# Patient Record
Sex: Female | Born: 1980 | Race: Black or African American | Hispanic: No | Marital: Single | Smoking: Current every day smoker
Health system: Southern US, Community
[De-identification: ages and names within clinical notes are randomized; demographics above are authoritative.]

---

## 2015-02-12 LAB — HM HIV SCREENING LAB: HM HIV Screening: NEGATIVE

## 2015-02-12 LAB — HM PAP SMEAR: HM Pap smear: NEGATIVE

## 2015-03-26 ENCOUNTER — Emergency Department: Payer: 59

## 2015-03-26 ENCOUNTER — Emergency Department
Admission: EM | Admit: 2015-03-26 | Discharge: 2015-03-26 | Disposition: A | Discharge status: Home / Self Care | Payer: 59 | Attending: Emergency Medicine | Admitting: Emergency Medicine

## 2015-03-26 ENCOUNTER — Encounter: Payer: Self-pay | Admitting: Emergency Medicine

## 2015-03-26 DIAGNOSIS — J069 Acute upper respiratory infection, unspecified: Secondary | ICD-10-CM | POA: Diagnosis not present

## 2015-03-26 DIAGNOSIS — R0602 Shortness of breath: Secondary | ICD-10-CM | POA: Diagnosis present

## 2015-03-26 DIAGNOSIS — F17200 Nicotine dependence, unspecified, uncomplicated: Secondary | ICD-10-CM | POA: Diagnosis not present

## 2015-03-26 DIAGNOSIS — R059 Cough, unspecified: Secondary | ICD-10-CM

## 2015-03-26 DIAGNOSIS — Z3202 Encounter for pregnancy test, result negative: Secondary | ICD-10-CM | POA: Insufficient documentation

## 2015-03-26 DIAGNOSIS — R05 Cough: Secondary | ICD-10-CM

## 2015-03-26 LAB — COMPREHENSIVE METABOLIC PANEL
ALBUMIN: 4.1 g/dL (ref 3.5–5.0)
ALK PHOS: 35 U/L — AB (ref 38–126)
ALT: 19 U/L (ref 14–54)
ANION GAP: 5 (ref 5–15)
AST: 19 U/L (ref 15–41)
BUN: 11 mg/dL (ref 6–20)
CALCIUM: 8.9 mg/dL (ref 8.9–10.3)
CHLORIDE: 109 mmol/L (ref 101–111)
CO2: 25 mmol/L (ref 22–32)
Creatinine, Ser: 0.7 mg/dL (ref 0.44–1.00)
GFR calc non Af Amer: >60 mL/min (ref >60)
GLUCOSE: 102 mg/dL — AB (ref 65–99)
POTASSIUM: 4.6 mmol/L (ref 3.5–5.1)
SODIUM: 139 mmol/L (ref 135–145)
Total Bilirubin: 0.7 mg/dL (ref 0.3–1.2)
Total Protein: 7.6 g/dL (ref 6.5–8.1)

## 2015-03-26 LAB — URINALYSIS COMPLETE WITH MICROSCOPIC (ARMC ONLY)
BILIRUBIN URINE: NEGATIVE
Bacteria, UA: NONE SEEN
Glucose, UA: NEGATIVE mg/dL
KETONES UR: NEGATIVE mg/dL
Nitrite: NEGATIVE
PH: 5 (ref 5.0–8.0)
Protein, ur: NEGATIVE mg/dL
Specific Gravity, Urine: 1.028 (ref 1.005–1.030)

## 2015-03-26 LAB — CBC
HEMATOCRIT: 42.5 % (ref 35.0–47.0)
HEMOGLOBIN: 14.1 g/dL (ref 12.0–16.0)
MCH: 27.4 pg (ref 26.0–34.0)
MCHC: 33.2 g/dL (ref 32.0–36.0)
MCV: 82.5 fL (ref 80.0–100.0)
Platelets: 188 10*3/uL (ref 150–440)
RBC: 5.16 MIL/uL (ref 3.80–5.20)
RDW: 14 % (ref 11.5–14.5)
WBC: 4.6 10*3/uL (ref 3.6–11.0)

## 2015-03-26 LAB — POCT PREGNANCY, URINE: Preg Test, Ur: NEGATIVE

## 2015-03-26 LAB — LIPASE, BLOOD: LIPASE: 26 U/L (ref 11–51)

## 2015-03-26 LAB — TROPONIN I: Troponin I: <0.03 ng/mL (ref <0.031)

## 2015-03-26 NOTE — ED Provider Notes (Signed)
[facility] Emergency Department Provider Note    ____________________________________________  Time seen: 1215  I have reviewed the triage vital signs and the nursing notes.   HISTORY  Chief Complaint Shortness of Breath; Emesis; and Weakness   History limited by: Not Limited   HPI Briana Jacobson is a 34 y.o. female presents to the emergency department today because of concerns for some cough, shortness of breath. Patient states his symptoms have been present for the past 3 days. She states that she has felt sick. She has tried Tylenol Cold medication without great relief. The patient states today at work she told them that she was feeling bad and the nurse checked on her. The nurse noticed an irregular heart rhythm and asked the patient to the emergency department. Of note the patient states that her mother had been sick and her kids had also been sick. The patient has had some fevers.   History reviewed. No pertinent past medical history.  There are no active problems to display for this patient.   History reviewed. No pertinent past surgical history.  No current outpatient prescriptions on file.  Allergies Review of patient's allergies indicates no known allergies.  No family history on file.  Social History Social History  Substance Use Topics  . Smoking status: Current Every Day Smoker  . Smokeless tobacco: None  . Alcohol Use: No    Review of Systems  Constitutional: Positive for fever. Cardiovascular: Negative for chest pain. Respiratory: Positive for shortness of breath. Gastrointestinal: Negative for abdominal pain, vomiting and diarrhea. Neurological: Negative for headaches, focal weakness or numbness.  10-point ROS otherwise negative.  ____________________________________________   PHYSICAL EXAM:  VITAL SIGNS: ED Triage Vitals  Enc Vitals Group     BP 03/26/15 0913 125/76 mmHg     Pulse Rate 03/26/15 0913 91     Resp  --      Temp 03/26/15 0913 98.2 F (36.8 C)     Temp Source 03/26/15 0913 Oral     SpO2 03/26/15 0913 97 %     Weight 03/26/15 0911 185 lb (83.915 kg)     Height 03/26/15 0911  (1.549 m)   Constitutional: Alert and oriented. Well appearing and in no distress. Eyes: Conjunctivae are normal. PERRL. Normal extraocular movements. ENT   Head: Normocephalic and atraumatic.   Nose: No congestion/rhinnorhea.   Mouth/Throat: Mucous membranes are moist.   Neck: No stridor. Hematological/Lymphatic/Immunilogical: No cervical lymphadenopathy. Cardiovascular: Normal rate, regular rhythm.  No murmurs, rubs, or gallops. Respiratory: Normal respiratory effort without tachypnea nor retractions. Breath sounds are clear and equal bilaterally. No wheezes/rales/rhonchi. Gastrointestinal: Soft and nontender. No distention.  Genitourinary: Deferred Musculoskeletal: Normal range of motion in all extremities. No joint effusions.  No lower extremity tenderness nor edema. Neurologic:  Normal speech and language. No gross focal neurologic deficits are appreciated.  Skin:  Skin is warm, dry and intact. No rash noted. Psychiatric: Mood and affect are normal. Speech and behavior are normal. Patient exhibits appropriate insight and judgment.  ____________________________________________    LABS (pertinent positives/negatives)  Labs Reviewed  COMPREHENSIVE METABOLIC PANEL - Abnormal; Notable for the following:    Glucose, Bld 102 (*)    Alkaline Phosphatase 35 (*)    All other components within normal limits  URINALYSIS COMPLETEWITH MICROSCOPIC ([facility] ONLY) - Abnormal; Notable for the following:    Color, Urine YELLOW (*)    APPearance CLEAR (*)    Hgb urine dipstick 1+ (*)    Leukocytes,  UA TRACE (*)    Squamous Epithelial / LPF 0-5 (*)    All other components within normal limits  LIPASE, BLOOD  CBC  TROPONIN I  POC URINE PREG, ED  POCT PREGNANCY, URINE      ____________________________________________   EKG  I, Phineas SemenGraydon Cheskel Silverio, attending physician, personally viewed and interpreted this EKG  EKG Time: 0913 Rate: 86 Rhythm: sinus rhythm with pvcs Axis: normal Intervals: qtc 433 QRS: narrow ST changes: no st elevation Impression: abnormal ekg ____________________________________________    RADIOLOGY  CXR IMPRESSION: No edema or consolidation.  ____________________________________________   PROCEDURES  Procedure(s) performed: None  Critical Care performed: No  ____________________________________________   INITIAL IMPRESSION / ASSESSMENT AND PLAN / ED COURSE  Pertinent labs & imaging results that were available during my care of the patient were reviewed by me and considered in my medical decision making (see chart for details).  The patient presented to the emergency department today because of concerns for irregular heartbeat and cold-like symptoms. In terms of her cold-like symptoms and think likely patient has a viral illness especially given that the respiratory family is also had a similar illness. Terms of the heartbeat EKG was performed and shows some premature atrial versus ventricular contractions. I discussed this finding with the patient. Will plan on discharging home.  ____________________________________________   FINAL CLINICAL IMPRESSION(S) / ED DIAGNOSES  Final diagnoses:  Cough     Phineas SemenGraydon Jahnaya Branscome, MD 03/26/15 1249

## 2015-03-26 NOTE — ED Notes (Signed)
Pt reports vomiting, weakness, shortness of breath x3 days. Reports went to work and felt bad, reports getting her blood pressure and HR checked by someone at work and was told they were abnormal. Pt reports they checked her CBG and it was normal. Pt reports fever and chills.

## 2015-03-26 NOTE — Discharge Instructions (Signed)
Please seek medical attention for any high fevers, chest pain, shortness of breath, change in behavior, persistent vomiting, bloody stool or any other new or concerning symptoms. ° ° °Upper Respiratory Infection, Adult °Most upper respiratory infections (URIs) are a viral infection of the air passages leading to the lungs. A URI affects the nose, throat, and upper air passages. The most common type of URI is nasopharyngitis and is typically referred to as "the common cold." °URIs run their course and usually go away on their own. Most of the time, a URI does not require medical attention, but sometimes a bacterial infection in the upper airways can follow a viral infection. This is called a secondary infection. Sinus and middle ear infections are common types of secondary upper respiratory infections. °Bacterial pneumonia can also complicate a URI. A URI can worsen asthma and chronic obstructive pulmonary disease (COPD). Sometimes, these complications can require emergency medical care and may be life threatening.  °CAUSES °Almost all URIs are caused by viruses. A virus is a type of germ and can spread from one person to another.  °RISKS FACTORS °You may be at risk for a URI if:  °· You smoke.   °· You have chronic heart or lung disease. °· You have a weakened defense (immune) system.   °· You are very young or very old.   °· You have nasal allergies or asthma. °· You work in crowded or poorly ventilated areas. °· You work in health care facilities or schools. °SIGNS AND SYMPTOMS  °Symptoms typically develop 2-3 days after you come in contact with a cold virus. Most viral URIs last 7-10 days. However, viral URIs from the influenza virus (flu virus) can last 14-18 days and are typically more severe. Symptoms may include:  °· Runny or stuffy (congested) nose.   °· Sneezing.   °· Cough.   °· Sore throat.   °· Headache.   °· Fatigue.   °· Fever.   °· Loss of appetite.   °· Pain in your forehead, behind your eyes, and  over your cheekbones (sinus pain). °· Muscle aches.   °DIAGNOSIS  °Your health care provider may diagnose a URI by: °· Physical exam. °· Tests to check that your symptoms are not due to another condition such as: °¨ Strep throat. °¨ Sinusitis. °¨ Pneumonia. °¨ Asthma. °TREATMENT  °A URI goes away on its own with time. It cannot be cured with medicines, but medicines may be prescribed or recommended to relieve symptoms. Medicines may help: °· Reduce your fever. °· Reduce your cough. °· Relieve nasal congestion. °HOME CARE INSTRUCTIONS  °· Take medicines only as directed by your health care provider.   °· Gargle warm saltwater or take cough drops to comfort your throat as directed by your health care provider. °· Use a warm mist humidifier or inhale steam from a shower to increase air moisture. This may make it easier to breathe. °· Drink enough fluid to keep your urine clear or pale yellow.   °· Eat soups and other clear broths and maintain good nutrition.   °· Rest as needed.   °· Return to work when your temperature has returned to normal or as your health care provider advises. You may need to stay home longer to avoid infecting others. You can also use a face mask and careful hand washing to prevent spread of the virus. °· Increase the usage of your inhaler if you have asthma.   °· Do not use any tobacco products, including cigarettes, chewing tobacco, or electronic cigarettes. If you need help quitting, ask your health care   provider. °PREVENTION  °The best way to protect yourself from getting a cold is to practice good hygiene.  °· Avoid oral or hand contact with people with cold symptoms.   °· Wash your hands often if contact occurs.   °There is no clear evidence that vitamin C, vitamin E, echinacea, or exercise reduces the chance of developing a cold. However, it is always recommended to get plenty of rest, exercise, and practice good nutrition.  °SEEK MEDICAL CARE IF:  °· You are getting worse rather than  better.   °· Your symptoms are not controlled by medicine.   °· You have chills. °· You have worsening shortness of breath. °· You have brown or red mucus. °· You have yellow or brown nasal discharge. °· You have pain in your face, especially when you bend forward. °· You have a fever. °· You have swollen neck glands. °· You have pain while swallowing. °· You have white areas in the back of your throat. °SEEK IMMEDIATE MEDICAL CARE IF:  °· You have severe or persistent: °¨ Headache. °¨ Ear pain. °¨ Sinus pain. °¨ Chest pain. °· You have chronic lung disease and any of the following: °¨ Wheezing. °¨ Prolonged cough. °¨ Coughing up blood. °¨ A change in your usual mucus. °· You have a stiff neck. °· You have changes in your: °¨ Vision. °¨ Hearing. °¨ Thinking. °¨ Mood. °MAKE SURE YOU:  °· Understand these instructions. °· Will watch your condition. °· Will get help right away if you are not doing well or get worse. °  °This information is not intended to replace advice given to you by your health care provider. Make sure you discuss any questions you have with your health care provider. °  °Document Released: 09/08/2000 Document Revised: 07/30/2014 Document Reviewed: 06/20/2013 °Elsevier Interactive Patient Education ©2016 [employer] ° °

## 2016-05-28 ENCOUNTER — Emergency Department
Admission: EM | Admit: 2016-05-28 | Discharge: 2016-05-28 | Disposition: A | Discharge status: Home / Self Care | Payer: 59 | Attending: Emergency Medicine | Admitting: Emergency Medicine

## 2016-05-28 ENCOUNTER — Encounter: Payer: Self-pay | Admitting: Emergency Medicine

## 2016-05-28 DIAGNOSIS — R05 Cough: Secondary | ICD-10-CM | POA: Diagnosis present

## 2016-05-28 DIAGNOSIS — J069 Acute upper respiratory infection, unspecified: Secondary | ICD-10-CM | POA: Diagnosis not present

## 2016-05-28 DIAGNOSIS — B9789 Other viral agents as the cause of diseases classified elsewhere: Secondary | ICD-10-CM

## 2016-05-28 DIAGNOSIS — F17200 Nicotine dependence, unspecified, uncomplicated: Secondary | ICD-10-CM | POA: Diagnosis not present

## 2016-05-28 MED ORDER — GUAIFENESIN-CODEINE 100-10 MG/5ML PO SYRP: 5.0000 mL | ORAL_SOLUTION | Freq: Three times a day (TID) | ORAL | 0 refills | Status: DC | PRN

## 2016-05-28 NOTE — ED Provider Notes (Signed)
[facility] Emergency Department Provider Note  ____________________________________________  Time seen: Approximately 7:56 AM  I have reviewed the triage vital signs and the nursing notes.   HISTORY  Chief Complaint Cough   HPI Briana Jacobson is a 36 y.o. female who presents to the emergency department for evaluation of cough, congestion, and headache. Symptoms started 2 days ago. No relief with Tylenol Cold medicine. No known fever.   History reviewed. No pertinent past medical history.  There are no active problems to display for this patient.   No past surgical history on file.  Prior to Admission medications   Medication Sig Start Date End Date Taking? Authorizing Provider  guaiFENesin-codeine (ROBITUSSIN AC) 100-10 MG/5ML syrup Take 5 mLs by mouth 3 (three) times daily as needed for cough. 05/28/16   Chinita Pester, FNP    Allergies Patient has no known allergies.  No family history on file.  Social History Social History  Substance Use Topics  . Smoking status: Current Every Day Smoker  . Smokeless tobacco: Never Used  . Alcohol use No    Review of Systems Constitutional: Negative fever/chills ENT: Negative for sore throat. Cardiovascular: Denies chest pain. Respiratory: Negative for shortness of breath. Positive for cough. Gastrointestinal: Negative for nausea,  Negative for vomiting.  Negative for diarrhea.  Musculoskeletal: Negative for body aches Skin: Negataive for rash. Neurological: Positive for headaches ____________________________________________   PHYSICAL EXAM:  VITAL SIGNS: ED Triage Vitals  Enc Vitals Group     BP 05/28/16 0714 129/86     Pulse Rate 05/28/16 0714 86     Resp 05/28/16 0714 20     Temp 05/28/16 0714 98.3 F (36.8 C)     Temp Source 05/28/16 0714 Oral     SpO2 05/28/16 0714 100 %     Weight --      Height --      Head Circumference --      Peak Flow --      Pain Score 05/28/16 0710 7   Pain Loc --      Pain Edu? --      Excl. in GC? --     Constitutional: Alert and oriented. Well appearing and in no acute distress. Eyes: Conjunctivae are normal. EOMI. Ears: Right TM normal; left TM injected with mild erythema without loss of light reflex Nose: No congestion noted; no rhinnorhea. Mouth/Throat: Mucous membranes are moist.  Oropharynx normal. Tonsils not visualized. Neck: No stridor.  Lymphatic: No cervical lymphadenopathy. Cardiovascular: Normal rate, regular rhythm. Good peripheral circulation. Respiratory: Normal respiratory effort.  No retractions. Breath sounds clear throughout. Gastrointestinal: Soft and nontender.  Musculoskeletal: FROM x 4 extremities.  Neurologic:  Normal speech and language.  Skin:  Skin is warm, dry and intact. No rash noted. Psychiatric: Mood and affect are normal. Speech and behavior are normal.  ____________________________________________   LABS (all labs ordered are listed, but only abnormal results are displayed)  Labs Reviewed - No data to display ____________________________________________  EKG  Not indicated ____________________________________________  RADIOLOGY  Not indicated. ____________________________________________   PROCEDURES  Procedure(s) performed: None  Critical Care performed: No ____________________________________________   INITIAL IMPRESSION / ASSESSMENT AND PLAN / ED COURSE  36 year old female presenting for treatment of URI. She is to take the Robitussin AC and ibuprofen as needed. She is to follow up with primary care for symptoms that do not improve over the next few days. She is to return to the ER for symptoms that change  or worsen if unable to schedule an appointment.  Pertinent labs & imaging results that were available during my care of the patient were reviewed by me and considered in my medical decision making (see chart for details).  New Prescriptions   GUAIFENESIN-CODEINE  (ROBITUSSIN AC) 100-10 MG/5ML SYRUP    Take 5 mLs by mouth 3 (three) times daily as needed for cough.    If controlled substance prescribed during this visit, 12 month history viewed on the [facility] prior to issuing an initial prescription for Schedule II or III opiod. ____________________________________________   FINAL CLINICAL IMPRESSION(S) / ED DIAGNOSES  Final diagnoses:  Viral URI with cough    Note:  This document was prepared using Dragon voice recognition software and may include unintentional dictation errors.     Chinita PesterCari B Linsey Hirota, FNP 05/28/16 [id number]    Governor Rooksebecca Lord, MD 05/28/16 [phone]

## 2016-05-28 NOTE — ED Triage Notes (Signed)
Pt to ed with c/o cough, green yellow productive sputum x 2 days. Denies fever.

## 2016-05-28 NOTE — ED Notes (Signed)
See triage note.  States she developed headache and cough 2 days ago  Denies any fever  States headache is relieved with tylenol   States cough is prod and having some discomfort in chest with cough  Afebrile on arrival

## 2017-01-24 ENCOUNTER — Emergency Department
Admission: EM | Admit: 2017-01-24 | Discharge: 2017-01-24 | Disposition: A | Discharge status: Home / Self Care | Payer: 59 | Attending: Emergency Medicine | Admitting: Emergency Medicine

## 2017-01-24 DIAGNOSIS — B9789 Other viral agents as the cause of diseases classified elsewhere: Secondary | ICD-10-CM | POA: Insufficient documentation

## 2017-01-24 DIAGNOSIS — F17200 Nicotine dependence, unspecified, uncomplicated: Secondary | ICD-10-CM | POA: Insufficient documentation

## 2017-01-24 DIAGNOSIS — J069 Acute upper respiratory infection, unspecified: Secondary | ICD-10-CM | POA: Insufficient documentation

## 2017-01-24 NOTE — ED Provider Notes (Signed)
[facility] Emergency Department Provider Note  ____________________________________________   I have reviewed the triage vital signs and the nursing notes.   HISTORY  Chief Complaint Cough and URI    HPI Briana Jacobson is a 36 y.o. female who is largely healthy, presents today with what she believes to be her yearly cold.  Rhinorrhea cough and mild sore throat no fever no chills no shortness of breath, some mild body aches all started yesterday. Cough is occasionally productive but mostly dry.  Positive sick contacts.  Was sent here by work.   No past medical history on file.  There are no active problems to display for this patient.   No past surgical history on file.  Prior to Admission medications   Medication Sig Start Date End Date Taking? Authorizing Provider  guaiFENesin-codeine (ROBITUSSIN AC) 100-10 MG/5ML syrup Take 5 mLs by mouth 3 (three) times daily as needed for cough. 05/28/16   Chinita Pesterriplett, Cari B, FNP    Allergies Patient has no known allergies.  No family history on file.  Social History Social History  Substance Use Topics  . Smoking status: Current Every Day Smoker  . Smokeless tobacco: Never Used  . Alcohol use No    Review of Systems Constitutional: No fever/chills Eyes: No visual changes. ENT: Positive sore throat. No stiff neck no neck pain Cardiovascular: Denies chest pain. Respiratory: Denies shortness of breath.  Positive cough Gastrointestinal:   no vomiting.  No diarrhea.  No constipation. Genitourinary: Negative for dysuria. Musculoskeletal: Negative lower extremity swelling Skin: Negative for rash. Neurological: Negative for severe headaches, focal weakness or numbness.   ____________________________________________   PHYSICAL EXAM:  VITAL SIGNS: ED Triage Vitals  Enc Vitals Group     BP 01/24/17 1947 110/80     Pulse Rate 01/24/17 1947 94     Resp 01/24/17 1947 18     Temp 01/24/17 1947 98.2 F (36.8  C)     Temp Source 01/24/17 1947 Oral     SpO2 01/24/17 1947 100 %     Weight 01/24/17 1943 185 lb (83.9 kg)     Height 01/24/17 1943 5\' 2"  (1.575 m)     Head Circumference --      Peak Flow --      Pain Score 01/24/17 1943 0     Pain Loc --      Pain Edu? --      Excl. in GC? --     Constitutional: Alert and oriented. Well appearing and in no acute distress. Eyes: Conjunctivae are normal Head: Atraumatic HEENT: Positive congestion/rhinnorhea. Mucous membranes are moist.  Oropharynx non-erythematous slight cobblestoning Neck:   Nontender with no meningismus, no masses, no stridor Cardiovascular: Normal rate, regular rhythm. Grossly normal heart sounds.  Good peripheral circulation. Respiratory: Normal respiratory effort.  No retractions. Lungs CTAB. Abdominal: Soft and nontender. No distention. No guarding no rebound Back:  There is no focal tenderness or step off.  there is no midline tenderness there are no lesions noted. there is no CVA tenderness Musculoskeletal: No lower extremity tenderness, no upper extremity tenderness. No joint effusions, no DVT signs strong distal pulses no edema Neurologic:  Normal speech and language. No gross focal neurologic deficits are appreciated.  Skin:  Skin is warm, dry and intact. No rash noted. Psychiatric: Mood and affect are normal. Speech and behavior are normal.  ____________________________________________   LABS (all labs ordered are listed, but only abnormal results are displayed)  Labs Reviewed - No  data to display  Pertinent labs  results that were available during my care of the patient were reviewed by me and considered in my medical decision making (see chart for details). ____________________________________________  EKG  I personally interpreted any EKGs ordered by me or triage  ____________________________________________  RADIOLOGY  Pertinent labs & imaging results that were available during my care of the patient  were reviewed by me and considered in my medical decision making (see chart for details). If possible, patient and/or family made aware of any abnormal findings. ____________________________________________    PROCEDURES  Procedure(s) performed: None  Procedures  Critical Care performed: None  ____________________________________________   INITIAL IMPRESSION / ASSESSMENT AND PLAN / ED COURSE  Pertinent labs & imaging results that were available during my care of the patient were reviewed by me and considered in my medical decision making (see chart for details).  Here with likely viral URI no evidence of bacterial infection meningitis or something requiring antibiotics.  Supportive care and rest has been prescribed for the patient, return precautions and follow-up given and understood.    ____________________________________________   FINAL CLINICAL IMPRESSION(S) / ED DIAGNOSES  Final diagnoses:  None      This chart was dictated using voice recognition software.  Despite best efforts to proofread,  errors can occur which can change meaning.      Jeanmarie Plant, MD 01/24/17 2118

## 2017-01-24 NOTE — ED Triage Notes (Addendum)
Pt arrives to ED via POV with c/o weakness, cough, and congestion x1 day. Pt denies any c/o pain; no CP, no SHOB, no N/V/D or fever/chills. Productive cough with yellow sputum. 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 Pt is A&O, in NAD; RR even, regular, and unlabored; skin color/temp is WNL.

## 2017-07-16 ENCOUNTER — Other Ambulatory Visit: Payer: Self-pay

## 2017-07-16 ENCOUNTER — Emergency Department
Admission: EM | Admit: 2017-07-16 | Discharge: 2017-07-16 | Disposition: A | Discharge status: Home / Self Care | Payer: Self-pay | Attending: Emergency Medicine | Admitting: Emergency Medicine

## 2017-07-16 DIAGNOSIS — K529 Noninfective gastroenteritis and colitis, unspecified: Secondary | ICD-10-CM | POA: Insufficient documentation

## 2017-07-16 DIAGNOSIS — F17210 Nicotine dependence, cigarettes, uncomplicated: Secondary | ICD-10-CM | POA: Insufficient documentation

## 2017-07-16 LAB — COMPREHENSIVE METABOLIC PANEL WITH GFR
ALT: 21 U/L (ref 14–54)
AST: 26 U/L (ref 15–41)
Albumin: 3.8 g/dL (ref 3.5–5.0)
Alkaline Phosphatase: 42 U/L (ref 38–126)
Anion gap: 4 — ABNORMAL LOW (ref 5–15)
BUN: 13 mg/dL (ref 6–20)
CO2: 26 mmol/L (ref 22–32)
Calcium: 8.9 mg/dL (ref 8.9–10.3)
Chloride: 106 mmol/L (ref 101–111)
Creatinine, Ser: 0.56 mg/dL (ref 0.44–1.00)
GFR calc Af Amer: >60 mL/min
GFR calc non Af Amer: >60 mL/min
Glucose, Bld: 96 mg/dL (ref 65–99)
Potassium: 4.4 mmol/L (ref 3.5–5.1)
Sodium: 136 mmol/L (ref 135–145)
Total Bilirubin: 0.4 mg/dL (ref 0.3–1.2)
Total Protein: 7.2 g/dL (ref 6.5–8.1)

## 2017-07-16 LAB — LIPASE, BLOOD: Lipase: 35 U/L (ref 11–51)

## 2017-07-16 LAB — CBC WITH DIFFERENTIAL/PLATELET
Basophils Absolute: 0.1 K/uL (ref 0–0.1)
Basophils Relative: 1 %
Eosinophils Absolute: 0.2 K/uL (ref 0–0.7)
Eosinophils Relative: 4 %
HCT: 38.7 % (ref 35.0–47.0)
Hemoglobin: 12.9 g/dL (ref 12.0–16.0)
Lymphocytes Relative: 33 %
Lymphs Abs: 1.9 K/uL (ref 1.0–3.6)
MCH: 28.1 pg (ref 26.0–34.0)
MCHC: 33.3 g/dL (ref 32.0–36.0)
MCV: 84.2 fL (ref 80.0–100.0)
Monocytes Absolute: 0.6 K/uL (ref 0.2–0.9)
Monocytes Relative: 10 %
Neutro Abs: 3 K/uL (ref 1.4–6.5)
Neutrophils Relative %: 52 %
Platelets: 183 K/uL (ref 150–440)
RBC: 4.59 MIL/uL (ref 3.80–5.20)
RDW: 14.7 % — ABNORMAL HIGH (ref 11.5–14.5)
WBC: 5.7 K/uL (ref 3.6–11.0)

## 2017-07-16 LAB — HCG, QUANTITATIVE, PREGNANCY: hCG, Beta Chain, Quant, S: <1 m[IU]/mL

## 2017-07-16 MED ORDER — DICYCLOMINE HCL 20 MG PO TABS: 20.0000 mg | ORAL_TABLET | Freq: Three times a day (TID) | ORAL | 0 refills | Status: DC | PRN

## 2017-07-16 MED ORDER — ONDANSETRON HCL 4 MG PO TABS: 4.0000 mg | ORAL_TABLET | Freq: Three times a day (TID) | ORAL | 0 refills | Status: DC | PRN

## 2017-07-16 NOTE — ED Triage Notes (Signed)
Pt states that at 2am she started with abd pain that comes and goes with diarrhea and vomiting - pt vomited x1 and loose stools with all intake

## 2017-07-16 NOTE — ED Notes (Signed)
[facility] providers state this is a triage 3

## 2017-07-16 NOTE — ED Notes (Signed)
Pt reports diarrhea x 13 today, denies contact with sick people or suspicious ingestion, pt appears hydrated and NAD  Pt coached on food choices to increase hydration and decrease motility

## 2017-07-16 NOTE — ED Provider Notes (Signed)
[facility] Emergency Department Provider Note    ____________________________________________   I have reviewed the triage vital signs and the nursing notes.   HISTORY  Chief Complaint Emesis and Diarrhea   History limited by: Not Limited   HPI Briana Jacobson is a 37 y.o. female who presents to the emergency department today because of concern for diarrhea, vomiting and abdominal pain. The patient states symptoms started at roughly 2 am. She has had multiple episodes of diarrhea. This is accompanied by abdominal pain located in the central and left aspect of her abdomen. It is relieved after defecation. She has had some associated nausea and vomiting. No bleeding. No fevers.   History reviewed. No pertinent past medical history.  There are no active problems to display for this patient.   History reviewed. No pertinent surgical history.  Allergies Patient has no known allergies.  No family history on file.  Social History Social History   Tobacco Use  . Smoking status: Current Every Day Smoker    Packs/day: 0.50    Types: Cigarettes  . Smokeless tobacco: Never Used  Substance Use Topics  . Alcohol use: No  . Drug use: No    Review of Systems Constitutional: No fever/chills Eyes: No visual changes. ENT: No sore throat. Cardiovascular: Denies chest pain. Respiratory: Denies shortness of breath. Gastrointestinal: Positive for abdominal pain, nausea and diarrhea.  Genitourinary: Negative for dysuria. Musculoskeletal: Negative for back pain. Skin: Negative for rash. Neurological: Negative for headaches, focal weakness or numbness.  ____________________________________________   PHYSICAL EXAM:  VITAL SIGNS: ED Triage Vitals  Enc Vitals Group     BP 07/16/17 1447 136/89     Pulse Rate 07/16/17 1447 89     Resp 07/16/17 1447 16     Temp 07/16/17 1447 98 F (36.7 C)     Temp Source 07/16/17 1447 Oral     SpO2 07/16/17 1447 99 %     Weight 07/16/17 1448 180 lb (81.6 kg)     Height 07/16/17 1448 5\' 5"  (1.651 m)     Head Circumference --      Peak Flow --      Pain Score 07/16/17 1448 2   Constitutional: Alert and oriented. Well appearing and in no distress. Eyes: Conjunctivae are normal.  ENT   Head: Normocephalic and atraumatic.   Nose: No congestion/rhinnorhea.   Mouth/Throat: Mucous membranes are moist.   Neck: No stridor. Hematological/Lymphatic/Immunilogical: No cervical lymphadenopathy. Cardiovascular: Normal rate, regular rhythm.  No murmurs, rubs, or gallops. Respiratory: Normal respiratory effort without tachypnea nor retractions. Breath sounds are clear and equal bilaterally. No wheezes/rales/rhonchi. Gastrointestinal: Soft and non tender. No rebound. No guarding.  Genitourinary: Deferred Musculoskeletal: Normal range of motion in all extremities. No lower extremity edema. Neurologic:  Normal speech and language. No gross focal neurologic deficits are appreciated.  Skin:  Skin is warm, dry and intact. No rash noted. Psychiatric: Mood and affect are normal. Speech and behavior are normal. Patient exhibits appropriate insight and judgment.  ____________________________________________    LABS (pertinent positives/negatives)  CBC wnl except rdw 14.7 CMP wnl except anion gap 4 Lipase 35  ____________________________________________   EKG  None  ____________________________________________    RADIOLOGY  None  ____________________________________________   PROCEDURES  Procedures  ____________________________________________   INITIAL IMPRESSION / ASSESSMENT AND PLAN / ED COURSE  Pertinent labs & imaging results that were available during my care of the patient were reviewed by me and considered in my medical decision making (see  chart for details).  Patient presented to the emergency department today because of concerns for nausea vomiting diarrhea and abdominal pain.   Blood work and physical exam without concerning findings.  This point I think gastroenteritis likely, he either secondary to food poisoning or viral.  Doubt significant abdominal infection.  Will give patient Bentyl and Zofran.  Discussed return precautions with patient.   ____________________________________________   FINAL CLINICAL IMPRESSION(S) / ED DIAGNOSES  Final diagnoses:  Gastroenteritis     Note: This dictation was prepared with Dragon dictation. Any transcriptional errors that result from this process are unintentional     Phineas Semen, MD 07/16/17 1905

## 2017-07-16 NOTE — ED Notes (Signed)
ED Provider at bedside. 

## 2017-07-16 NOTE — Discharge Instructions (Addendum)
Please seek medical attention for any high fevers, chest pain, shortness of breath, change in behavior, persistent vomiting, bloody stool or any other new or concerning symptoms.  

## 2017-07-27 ENCOUNTER — Telehealth: Payer: Self-pay

## 2017-07-27 NOTE — Telephone Encounter (Signed)
Called pt about eligibility. Pt is over the income limit.

## 2017-08-24 ENCOUNTER — Encounter: Payer: Self-pay | Admitting: Medical Oncology

## 2017-08-24 ENCOUNTER — Emergency Department
Admission: EM | Admit: 2017-08-24 | Discharge: 2017-08-24 | Disposition: A | Discharge status: Home / Self Care | Payer: Self-pay | Attending: Emergency Medicine | Admitting: Emergency Medicine

## 2017-08-24 DIAGNOSIS — Z209 Contact with and (suspected) exposure to unspecified communicable disease: Secondary | ICD-10-CM | POA: Insufficient documentation

## 2017-08-24 DIAGNOSIS — IMO0001 Reserved for inherently not codable concepts without codable children: Secondary | ICD-10-CM

## 2017-08-24 DIAGNOSIS — T6291XA Toxic effect of unspecified noxious substance eaten as food, accidental (unintentional), initial encounter: Secondary | ICD-10-CM | POA: Insufficient documentation

## 2017-08-24 DIAGNOSIS — R197 Diarrhea, unspecified: Secondary | ICD-10-CM | POA: Insufficient documentation

## 2017-08-24 DIAGNOSIS — F17210 Nicotine dependence, cigarettes, uncomplicated: Secondary | ICD-10-CM | POA: Insufficient documentation

## 2017-08-24 LAB — COMPREHENSIVE METABOLIC PANEL WITH GFR
ALT: 21 U/L (ref 14–54)
AST: 25 U/L (ref 15–41)
Albumin: 3.8 g/dL (ref 3.5–5.0)
Alkaline Phosphatase: 45 U/L (ref 38–126)
Anion gap: 9 (ref 5–15)
BUN: 14 mg/dL (ref 6–20)
CO2: 24 mmol/L (ref 22–32)
Calcium: 9 mg/dL (ref 8.9–10.3)
Chloride: 104 mmol/L (ref 101–111)
Creatinine, Ser: 0.85 mg/dL (ref 0.44–1.00)
GFR calc Af Amer: >60 mL/min
GFR calc non Af Amer: >60 mL/min
Glucose, Bld: 106 mg/dL — ABNORMAL HIGH (ref 65–99)
Potassium: 3.8 mmol/L (ref 3.5–5.1)
Sodium: 137 mmol/L (ref 135–145)
Total Bilirubin: 0.4 mg/dL (ref 0.3–1.2)
Total Protein: 7.4 g/dL (ref 6.5–8.1)

## 2017-08-24 LAB — URINALYSIS, COMPLETE (UACMP) WITH MICROSCOPIC
Bilirubin Urine: NEGATIVE
GLUCOSE, UA: NEGATIVE mg/dL
KETONES UR: NEGATIVE mg/dL
LEUKOCYTES UA: NEGATIVE
NITRITE: NEGATIVE
PROTEIN: NEGATIVE mg/dL
Specific Gravity, Urine: 1.021 (ref 1.005–1.030)
pH: 6 (ref 5.0–8.0)

## 2017-08-24 LAB — CBC
HCT: 38.1 % (ref 35.0–47.0)
Hemoglobin: 12.8 g/dL (ref 12.0–16.0)
MCH: 27.9 pg (ref 26.0–34.0)
MCHC: 33.5 g/dL (ref 32.0–36.0)
MCV: 83.2 fL (ref 80.0–100.0)
PLATELETS: 216 10*3/uL (ref 150–440)
RBC: 4.59 MIL/uL (ref 3.80–5.20)
RDW: 13.9 % (ref 11.5–14.5)
WBC: 6.4 10*3/uL (ref 3.6–11.0)

## 2017-08-24 LAB — LIPASE, BLOOD: LIPASE: 34 U/L (ref 11–51)

## 2017-08-24 LAB — POCT PREGNANCY, URINE: Preg Test, Ur: NEGATIVE

## 2017-08-24 MED ORDER — ONDANSETRON 4 MG PO TBDP: 4.0000 mg | ORAL_TABLET | Freq: Three times a day (TID) | ORAL | 0 refills | Status: DC | PRN

## 2017-08-24 MED ORDER — ONDANSETRON 4 MG PO TBDP
4.0000 mg | ORAL_TABLET | Freq: Once | ORAL | Status: AC
  Administered 2017-08-24: 4 mg via ORAL
  Filled 2017-08-24: qty 1

## 2017-08-24 NOTE — ED Notes (Signed)
See triage note  States she ate some [place] food couple of days ago  Developed n/v/d  Last time vomited was yesterday  Last diarrhea was early this am  No fever or other complaints

## 2017-08-24 NOTE — ED Provider Notes (Signed)
[facility] Emergency Department Provider Note  ____________________________________________  Time seen: Approximately 6:02 PM  I have reviewed the triage vital signs and the nursing notes.   HISTORY  Chief Complaint Nausea and Diarrhea    HPI Briana Jacobson is a 37 y.o. female that presents to the emergency department for evaluation of N/V/D for 2 days. She ate [place] with her husband on Tuesday night. She woke up on Tuesday vomiting. Fiance is having similar symptoms. She has had diarrhea 3 times today. Last episode of vomiting and diarrhea was this morning. She has been able to drink water without vomiting all day. She has not attempted eating since last episode of vomiting. No blood in vomit or diarrhea. She is currently on her menstrual cycle. No fever, chills, abdominal pain.    History reviewed. No pertinent past medical history.  There are no active problems to display for this patient.   History reviewed. No pertinent surgical history.  Prior to Admission medications   Medication Sig Start Date End Date Taking? Authorizing Provider  dicyclomine (BENTYL) 20 MG tablet Take 1 tablet (20 mg total) by mouth 3 (three) times daily as needed (abdominal pain). 07/16/17   Phineas Semen, MD  guaiFENesin-codeine [facility]) 100-10 MG/5ML syrup Take 5 mLs by mouth 3 (three) times daily as needed for cough. 05/28/16   Triplett, Rulon Eisenmenger B, FNP  ondansetron (ZOFRAN ODT) 4 MG disintegrating tablet Take 1 tablet (4 mg total) by mouth every 8 (eight) hours as needed for nausea or vomiting. 08/24/17   Enid Derry, PA-C  ondansetron (ZOFRAN) 4 MG tablet Take 1 tablet (4 mg total) by mouth every 8 (eight) hours as needed for nausea or vomiting. 07/16/17   Phineas Semen, MD    Allergies Patient has no known allergies.  No family history on file.  Social History Social History   Tobacco Use  . Smoking status: Current Every Day Smoker    Packs/day: 0.50   Types: Cigarettes  . Smokeless tobacco: Never Used  Substance Use Topics  . Alcohol use: No  . Drug use: No     Review of Systems  Constitutional: No fever/chills Cardiovascular: No chest pain. Respiratory: No SOB. Gastrointestinal: No abdominal pain.  Positive for nausea, no vomiting.  Musculoskeletal: Negative for musculoskeletal pain. Skin: Negative for rash, abrasions, lacerations, ecchymosis. Neurological: Negative for headaches, numbness or tingling   ____________________________________________   PHYSICAL EXAM:  VITAL SIGNS: ED Triage Vitals  Enc Vitals Group     BP 08/24/17 1557 (!) 151/85     Pulse Rate 08/24/17 1557 93     Resp 08/24/17 1557 16     Temp 08/24/17 1557 98.9 F (37.2 C)     Temp Source 08/24/17 1557 Oral     SpO2 08/24/17 1557 100 %     Weight 08/24/17 1558 190 lb (86.2 kg)     Height 08/24/17 1558  (1.549 m)     Head Circumference --      Peak Flow --      Pain Score 08/24/17 1557 0     Pain Loc --      Pain Edu? --      Excl. in GC? --      Constitutional: Alert and oriented. Well appearing and in no acute distress. Eyes: Conjunctivae are normal. PERRL. EOMI. Head: Atraumatic. ENT:      Ears:      Nose: No congestion/rhinnorhea.      Mouth/Throat: Mucous membranes are moist.  Neck: No stridor.   Cardiovascular: Normal rate, regular rhythm.  Good peripheral circulation. Respiratory: Normal respiratory effort without tachypnea or retractions. Lungs CTAB. Good air entry to the bases with no decreased or absent breath sounds. Gastrointestinal: Bowel sounds 4 quadrants. Soft and nontender to palpation. No guarding or rigidity. No palpable masses. No distention. Musculoskeletal: Full range of motion to all extremities. No gross deformities appreciated. Neurologic:  Normal speech and language. No gross focal neurologic deficits are appreciated.  Skin:  Skin is warm, dry and intact. No rash noted. Psychiatric: Mood and affect are  normal. Speech and behavior are normal. Patient exhibits appropriate insight and judgement.   ____________________________________________   LABS (all labs ordered are listed, but only abnormal results are displayed)  Labs Reviewed  COMPREHENSIVE METABOLIC PANEL - Abnormal; Notable for the following components:      Result Value   Glucose, Bld 106 (*)    All other components within normal limits  URINALYSIS, COMPLETE (UACMP) WITH MICROSCOPIC - Abnormal; Notable for the following components:   Color, Urine YELLOW (*)    APPearance CLEAR (*)    Hgb urine dipstick LARGE (*)    Bacteria, UA RARE (*)    All other components within normal limits  LIPASE, BLOOD  CBC  POC URINE PREG, ED  POCT PREGNANCY, URINE   ____________________________________________  EKG   ____________________________________________  RADIOLOGY  No results found.  ____________________________________________    PROCEDURES  Procedure(s) performed:    Procedures    Medications  ondansetron (ZOFRAN-ODT) disintegrating tablet 4 mg (4 mg Oral Given 08/24/17 1839)     ____________________________________________   INITIAL IMPRESSION / ASSESSMENT AND PLAN / ED COURSE  Pertinent labs & imaging results that were available during my care of the patient were reviewed by me and considered in my medical decision making (see chart for details).  Review of the [place] CSRS was performed in accordance of the [facility] prior to dispensing any controlled drugs.   Patient's diagnosis is consistent with food poisoning. Vital signs, labwork, and exam are reassuring. Symptoms are improving. Patient will be discharged home with prescriptions for zofran. Patient is to follow up with PCP as directed. Patient is given ED precautions to return to the ED for any worsening or new symptoms.     ____________________________________________  FINAL CLINICAL IMPRESSION(S) / ED DIAGNOSES  Final diagnoses:  Food poisoning,  accidental or unintentional, initial encounter      NEW MEDICATIONS STARTED DURING THIS VISIT:  ED Discharge Orders        Ordered    ondansetron (ZOFRAN ODT) 4 MG disintegrating tablet  Every 8 hours PRN     08/24/17 1830          This chart was dictated using voice recognition software/Dragon. Despite best efforts to proofread, errors can occur which can change the meaning. Any change was purely unintentional.    Enid Derry, PA-C 08/24/17 2234    Loleta Rose, MD 08/25/17 [phone]

## 2017-08-24 NOTE — ED Triage Notes (Signed)
Pt reports 2 days ago she and her husband ate [place] food and since then she has been having NVD. Denies abd pain, denies fever.

## 2017-10-14 ENCOUNTER — Other Ambulatory Visit: Payer: Self-pay

## 2017-10-14 ENCOUNTER — Encounter: Payer: Self-pay | Admitting: Emergency Medicine

## 2017-10-14 ENCOUNTER — Emergency Department
Admission: EM | Admit: 2017-10-14 | Discharge: 2017-10-14 | Disposition: A | Discharge status: Home / Self Care | Payer: BLUE CROSS/BLUE SHIELD | Attending: Emergency Medicine | Admitting: Emergency Medicine

## 2017-10-14 DIAGNOSIS — M75102 Unspecified rotator cuff tear or rupture of left shoulder, not specified as traumatic: Secondary | ICD-10-CM

## 2017-10-14 DIAGNOSIS — F17210 Nicotine dependence, cigarettes, uncomplicated: Secondary | ICD-10-CM | POA: Diagnosis not present

## 2017-10-14 DIAGNOSIS — M25512 Pain in left shoulder: Secondary | ICD-10-CM | POA: Diagnosis present

## 2017-10-14 MED ORDER — MELOXICAM 15 MG PO TABS: 15.0000 mg | ORAL_TABLET | Freq: Every day | ORAL | 0 refills | Status: DC

## 2017-10-14 NOTE — Discharge Instructions (Signed)
Please rest and ice the left shoulder.  Take meloxicam daily with food.  You may also use Tylenol for additional pain relief but do not take any other NSAIDs such as Aleve or ibuprofen.  Follow-up with orthopedic provider in 1 week if no improvement.

## 2017-10-14 NOTE — ED Provider Notes (Signed)
[facility] EMERGENCY DEPARTMENT Provider Note   CSN: [id number] Arrival date & time: 10/14/17  1939     History   Chief Complaint Chief Complaint  Patient presents with  . Shoulder Pain    HPI Briana Jacobson is a 37 y.o. female presents to the emergency department for evaluation of left shoulder pain.  Shoulder pains been present this morning since awakening.  She states yesterday she was moving a lot of furniture at home but denies any trauma or injury.  She went to bed last night without any pain but this morning upon awakening she has pain along the proximal lateral shoulder with overhead lifting and reaching behind her back.  She describes the pain as aching, 3 out of 10.  She has not had any medications for pain.  No numbness tingling radicular symptoms.  No neck pain.  She is right-hand dominant.  She denies any chest pain, shortness of breath.  Pain is only increased with left shoulder motion she denies any pain with neck range of motion.  Minimal to no pain with rest.  HPI  History reviewed. No pertinent past medical history.  There are no active problems to display for this patient.   History reviewed. No pertinent surgical history.   OB History   None      Home Medications    Prior to Admission medications   Medication Sig Start Date End Date Taking? Authorizing Provider  dicyclomine (BENTYL) 20 MG tablet Take 1 tablet (20 mg total) by mouth 3 (three) times daily as needed (abdominal pain). 07/16/17   Phineas Semen, MD  guaiFENesin-codeine [facility]) 100-10 MG/5ML syrup Take 5 mLs by mouth 3 (three) times daily as needed for cough. 05/28/16   Triplett, Rulon Eisenmenger B, FNP  meloxicam (MOBIC) 15 MG tablet Take 1 tablet (15 mg total) by mouth daily. 10/14/17   Evon Slack, PA-C  ondansetron (ZOFRAN ODT) 4 MG disintegrating tablet Take 1 tablet (4 mg total) by mouth every 8 (eight) hours as needed for nausea or vomiting. 08/24/17   Enid Derry,  PA-C  ondansetron (ZOFRAN) 4 MG tablet Take 1 tablet (4 mg total) by mouth every 8 (eight) hours as needed for nausea or vomiting. 07/16/17   Phineas Semen, MD    Family History No family history on file.  Social History Social History   Tobacco Use  . Smoking status: Current Every Day Smoker    Packs/day: 0.50    Types: Cigarettes  . Smokeless tobacco: Never Used  Substance Use Topics  . Alcohol use: No  . Drug use: No     Allergies   Patient has no known allergies.   Review of Systems Review of Systems  Constitutional: Negative for fever.  Respiratory: Negative for shortness of breath.   Cardiovascular: Negative for chest pain.  Gastrointestinal: Negative for abdominal pain.  Genitourinary: Negative for difficulty urinating, dysuria and urgency.  Musculoskeletal: Positive for arthralgias. Negative for back pain, myalgias and neck pain.  Skin: Negative for rash.  Neurological: Negative for dizziness, numbness and headaches.     Physical Exam Updated Vital Signs BP 130/86 (BP Location: Left Arm)   Pulse 79   Temp 98.2 F (36.8 C) (Oral)   Resp 18   LMP 09/30/2017   SpO2 95%   Physical Exam  Constitutional: She is oriented to person, place, and time. She appears well-developed and well-nourished.  HENT:  Head: Normocephalic and atraumatic.  Right Ear: External ear normal.  Left Ear:  External ear normal.  Eyes: Conjunctivae are normal.  Neck: Normal range of motion.  Cardiovascular: Normal rate.  Pulmonary/Chest: Effort normal. No respiratory distress.  Musculoskeletal: Normal range of motion.  Examination of left shoulder shows patient has normal range of motion but she has pain with abduction flexion greater than 90 degrees.  She has a positive Hawkins and impingement test as well as a positive empty can test.  She has negative drop arm test.  She has good internal and external rotation with no discomfort.  She has good strength throughout the left upper  extremity with no weakness with supraspinatus, biceps, triceps strength.  Grip strength 5 out of 5.  She is neurovascular intact in left upper extremity.  Negative Spurling's test bilaterally.  Neurological: She is alert and oriented to person, place, and time.  Skin: Skin is warm. No rash noted.  Psychiatric: She has a normal mood and affect. Her behavior is normal. Thought content normal.     ED Treatments / Results  Labs (all labs ordered are listed, but only abnormal results are displayed) Labs Reviewed - No data to display  EKG None  Radiology No results found.  Procedures Procedures (including critical care time)  Medications Ordered in ED Medications - No data to display   Initial Impression / Assessment and Plan / ED Course  I have reviewed the triage vital signs and the nursing notes.  Pertinent labs & imaging results that were available during my care of the patient were reviewed by me and considered in my medical decision making (see chart for details).     37 year old female with left shoulder rotator cuff tendinitis.  History and physical exam consistent with rotator cuff syndrome.  No trauma or injury, no x-rays ordered today.  She will try a trial of NSAIDs.  She will rest and ice left shoulder.  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  She will follow with orthopedics if no improvement in 1 week.  Final Clinical Impressions(s) / ED Diagnoses   Final diagnoses:  Rotator cuff syndrome, left    ED Discharge Orders        Ordered    meloxicam (MOBIC) 15 MG tablet  Daily     10/14/17 1953       Ronnette JuniperGaines, Thomas C, PA-C 10/14/17 Milda [id number]    Paduchowski, Kevin, MD 10/14/17 [phone]

## 2017-10-14 NOTE — ED Triage Notes (Signed)
LFT shoulder pain since yesterday after moving furniture. No deformity noted. Denies numbness and tingling. VSS

## 2017-10-14 NOTE — ED Triage Notes (Signed)
First Nurse: patient ambulatory to stat desk. Patient states that she was moving furniture yesterday and now has left shoulder pain.

## 2018-05-01 ENCOUNTER — Other Ambulatory Visit: Payer: Self-pay

## 2018-05-01 ENCOUNTER — Emergency Department
Admission: EM | Admit: 2018-05-01 | Discharge: 2018-05-01 | Disposition: A | Discharge status: Home / Self Care | Payer: BLUE CROSS/BLUE SHIELD | Attending: Emergency Medicine | Admitting: Emergency Medicine

## 2018-05-01 DIAGNOSIS — F17210 Nicotine dependence, cigarettes, uncomplicated: Secondary | ICD-10-CM | POA: Insufficient documentation

## 2018-05-01 DIAGNOSIS — J209 Acute bronchitis, unspecified: Secondary | ICD-10-CM

## 2018-05-01 DIAGNOSIS — R0981 Nasal congestion: Secondary | ICD-10-CM | POA: Insufficient documentation

## 2018-05-01 DIAGNOSIS — J111 Influenza due to unidentified influenza virus with other respiratory manifestations: Secondary | ICD-10-CM | POA: Insufficient documentation

## 2018-05-01 DIAGNOSIS — R6889 Other general symptoms and signs: Secondary | ICD-10-CM

## 2018-05-01 LAB — INFLUENZA PANEL BY PCR (TYPE A & B)
Influenza A By PCR: NEGATIVE
Influenza B By PCR: NEGATIVE

## 2018-05-01 MED ORDER — AZITHROMYCIN 250 MG PO TABS: ORAL_TABLET | ORAL | 0 refills | Status: DC

## 2018-05-01 NOTE — Discharge Instructions (Addendum)
Follow-up with your regular doctor if not better in 3 days.  Return emergency department worsening.  Take medication as prescribed.  Drink plenty of fluids.  Take Tylenol and ibuprofen for fever as needed.

## 2018-05-01 NOTE — ED Triage Notes (Signed)
Pt states fever of 102 today. Took tylenol and motrin. Cough and congestion.   A&O, ambulatory. No distress noted.

## 2018-05-01 NOTE — ED Provider Notes (Signed)
[facility] Emergency Department Provider Note  ____________________________________________   First MD Initiated Contact with Patient 05/01/18 1742     (approximate)  I have reviewed the triage vital signs and the nursing notes.   HISTORY  Chief Complaint Cough and Nasal Congestion    HPI Latres Kizewski is a 38 y.o. female presents emergency department complaining of cough and congestion with yellow to green mucus.  Symptoms for 1 days.  States she had a fever of 102 this morning, but none now , denies chest pain or shortness of breath.   History reviewed. No pertinent past medical history.  There are no active problems to display for this patient.   History reviewed. No pertinent surgical history.  Prior to Admission medications   Medication Sig Start Date End Date Taking? Authorizing Provider  azithromycin (ZITHROMAX Z-PAK) 250 MG tablet 2 pills today then 1 pill a day for 4 days 05/01/18   Faythe Ghee, PA-C    Allergies Patient has no known allergies.  History reviewed. No pertinent family history.  Social History Social History   Tobacco Use  . Smoking status: Current Every Day Smoker    Packs/day: 0.50    Types: Cigarettes  . Smokeless tobacco: Never Used  Substance Use Topics  . Alcohol use: No  . Drug use: No    Review of Systems  Constitutional: No fever/chills Eyes: No visual changes. ENT: No sore throat. Respiratory: Positive cough and congestion, positive wheezing Genitourinary: Negative for dysuria. Musculoskeletal: Negative for back pain. Skin: Negative for rash.    ____________________________________________   PHYSICAL EXAM:  VITAL SIGNS: ED Triage Vitals  Enc Vitals Group     BP 05/01/18 1716 (!) 146/86     Pulse Rate 05/01/18 1715 97     Resp 05/01/18 1715 18     Temp 05/01/18 1715 98.6 F (37 C)     Temp Source 05/01/18 1715 Oral     SpO2 05/01/18 1715 97 %     Weight 05/01/18 1714 195 lb (88.5  kg)     Height 05/01/18 1714 5\' 5"  (1.651 m)     Head Circumference --      Peak Flow --      Pain Score 05/01/18 1714 0     Pain Loc --      Pain Edu? --      Excl. in GC? --     Constitutional: Alert and oriented. Well appearing and in no acute distress. Eyes: Conjunctivae are normal.  Head: Atraumatic. ENT: TMS clear bilaterally Nose: No congestion/rhinnorhea. Mouth/Throat: Mucous membranes are moist.   NECK: Is supple, no lymphadenopathy is noted  cardiovascular: Normal rate, regular rhythm.  Heart sounds are normal Respiratory: Normal respiratory effort.  No retractions, lungs clear to all station GU: deferred Musculoskeletal: FROM all extremities, warm and well perfused Neurologic:  Normal speech and language.  Skin:  Skin is warm, dry and intact. No rash noted. Psychiatric: Mood and affect are normal. Speech and behavior are normal.  ____________________________________________   LABS (all labs ordered are listed, but only abnormal results are displayed)  Labs Reviewed  INFLUENZA PANEL BY PCR (TYPE A & B)   ____________________________________________   ____________________________________________  RADIOLOGY  None  ____________________________________________   PROCEDURES  Procedure(s) performed: No  Procedures    ____________________________________________   INITIAL IMPRESSION / ASSESSMENT AND PLAN / ED COURSE  Pertinent labs & imaging results that were available during my care of the patient were reviewed by  me and considered in my medical decision making (see chart for details).   Patient is a 38 year old female presents emergency department with fever this morning along with cough with yellow to green mucus.  Physical exam patient appears well.  She is afebrile.  Lungs clear to all station.  Influenza test negative  Explained findings to the patient.  She was given a prescription for Z-Pak.  She is to follow-up with regular doctor if not  better in 3 days.  Return emergency department worsening.  States she understands will comply appears discharged stable condition.     As part of my medical decision making, I reviewed the following data within the electronic MEDICAL RECORD NUMBER Nursing notes reviewed and incorporated, Labs reviewed flu test is negative, Notes from prior ED visits and [place] Controlled Substance Database  ____________________________________________   FINAL CLINICAL IMPRESSION(S) / ED DIAGNOSES  Final diagnoses:  Acute bronchitis, unspecified organism  Flu-like symptoms      NEW MEDICATIONS STARTED DURING THIS VISIT:  New Prescriptions   AZITHROMYCIN (ZITHROMAX Z-PAK) 250 MG TABLET    2 pills today then 1 pill a day for 4 days     Note:  This document was prepared using Dragon voice recognition software and may include unintentional dictation errors.     Faythe Ghee, PA-C 05/01/18 2101    Jeanmarie Plant, MD 05/01/18 [phone]

## 2018-07-07 ENCOUNTER — Emergency Department
Admission: EM | Admit: 2018-07-07 | Discharge: 2018-07-07 | Disposition: A | Discharge status: Home / Self Care | Payer: Managed Care, Other (non HMO) | Attending: Emergency Medicine | Admitting: Emergency Medicine

## 2018-07-07 ENCOUNTER — Other Ambulatory Visit: Payer: Self-pay

## 2018-07-07 ENCOUNTER — Emergency Department: Payer: Managed Care, Other (non HMO)

## 2018-07-07 DIAGNOSIS — R319 Hematuria, unspecified: Secondary | ICD-10-CM

## 2018-07-07 DIAGNOSIS — R509 Fever, unspecified: Secondary | ICD-10-CM | POA: Diagnosis not present

## 2018-07-07 DIAGNOSIS — R1110 Vomiting, unspecified: Secondary | ICD-10-CM | POA: Diagnosis present

## 2018-07-07 DIAGNOSIS — F17210 Nicotine dependence, cigarettes, uncomplicated: Secondary | ICD-10-CM | POA: Insufficient documentation

## 2018-07-07 DIAGNOSIS — N3001 Acute cystitis with hematuria: Secondary | ICD-10-CM | POA: Insufficient documentation

## 2018-07-07 DIAGNOSIS — N390 Urinary tract infection, site not specified: Secondary | ICD-10-CM

## 2018-07-07 LAB — CBC WITH DIFFERENTIAL/PLATELET
Abs Immature Granulocytes: 0.01 10*3/uL (ref 0.00–0.07)
Basophils Absolute: 0 10*3/uL (ref 0.0–0.1)
Basophils Relative: 1 %
Eosinophils Absolute: 0.1 10*3/uL (ref 0.0–0.5)
Eosinophils Relative: 2 %
HCT: 37.9 % (ref 36.0–46.0)
Hemoglobin: 12.3 g/dL (ref 12.0–15.0)
Immature Granulocytes: 0 %
Lymphocytes Relative: 41 %
Lymphs Abs: 2.4 10*3/uL (ref 0.7–4.0)
MCH: 27.2 pg (ref 26.0–34.0)
MCHC: 32.5 g/dL (ref 30.0–36.0)
MCV: 83.7 fL (ref 80.0–100.0)
Monocytes Absolute: 0.5 10*3/uL (ref 0.1–1.0)
Monocytes Relative: 9 %
Neutro Abs: 2.6 10*3/uL (ref 1.7–7.7)
Neutrophils Relative %: 47 %
Platelets: 242 10*3/uL (ref 150–400)
RBC: 4.53 MIL/uL (ref 3.87–5.11)
RDW: 14.6 % (ref 11.5–15.5)
WBC: 5.7 10*3/uL (ref 4.0–10.5)
nRBC: 0 % (ref 0.0–0.2)

## 2018-07-07 LAB — COMPREHENSIVE METABOLIC PANEL
ALT: 24 U/L (ref 0–44)
AST: 27 U/L (ref 15–41)
Albumin: 3.8 g/dL (ref 3.5–5.0)
Alkaline Phosphatase: 39 U/L (ref 38–126)
Anion gap: 9 (ref 5–15)
BUN: 8 mg/dL (ref 6–20)
CO2: 22 mmol/L (ref 22–32)
Calcium: 8.6 mg/dL — ABNORMAL LOW (ref 8.9–10.3)
Chloride: 106 mmol/L (ref 98–111)
Creatinine, Ser: 0.6 mg/dL (ref 0.44–1.00)
GFR calc Af Amer: >60 mL/min (ref >60)
GFR calc non Af Amer: >60 mL/min (ref >60)
Glucose, Bld: 99 mg/dL (ref 70–99)
Potassium: 4.1 mmol/L (ref 3.5–5.1)
Sodium: 137 mmol/L (ref 135–145)
Total Bilirubin: 0.4 mg/dL (ref 0.3–1.2)
Total Protein: 6.9 g/dL (ref 6.5–8.1)

## 2018-07-07 LAB — URINALYSIS, COMPLETE (UACMP) WITH MICROSCOPIC
Bacteria, UA: NONE SEEN
Bilirubin Urine: NEGATIVE
Glucose, UA: NEGATIVE mg/dL
Ketones, ur: NEGATIVE mg/dL
Nitrite: NEGATIVE
Protein, ur: NEGATIVE mg/dL
Specific Gravity, Urine: 1.021 (ref 1.005–1.030)
pH: 7 (ref 5.0–8.0)

## 2018-07-07 LAB — POCT PREGNANCY, URINE: Preg Test, Ur: NEGATIVE

## 2018-07-07 LAB — LIPASE, BLOOD: Lipase: 34 U/L (ref 11–51)

## 2018-07-07 MED ORDER — ONDANSETRON 4 MG PO TBDP: 4.0000 mg | ORAL_TABLET | Freq: Three times a day (TID) | ORAL | 0 refills | Status: DC | PRN

## 2018-07-07 MED ORDER — SODIUM CHLORIDE 0.9 % IV BOLUS
1000.0000 mL | Freq: Once | INTRAVENOUS | Status: AC
  Administered 2018-07-07: 1000 mL via INTRAVENOUS

## 2018-07-07 MED ORDER — ONDANSETRON HCL 4 MG/2ML IJ SOLN
4.0000 mg | Freq: Once | INTRAMUSCULAR | Status: AC
  Administered 2018-07-07: 4 mg via INTRAVENOUS
  Filled 2018-07-07: qty 2

## 2018-07-07 MED ORDER — CEPHALEXIN 500 MG PO CAPS: 500.0000 mg | ORAL_CAPSULE | Freq: Three times a day (TID) | ORAL | 0 refills | Status: AC

## 2018-07-07 NOTE — Discharge Instructions (Signed)
The test we did all were okay except for the urine which looks like there is a UTI going on.  This could give you the symptoms you had.  I will give you some Keflex antibiotic 1 pill 3 times a day.  I will also give you some Zofran melt on your tongue wafers to use for nausea.  Please return here if you are worse, this includes feeling worse, higher fever, vomiting or course any shortness of breath.  It would be best to stay at home and quarantine for at least 3 days after the fever goes away without taking any antifever medicine and a total of 7 days from the beginning of the illness.  This is just in case you have any coronavirus.  I do not believe that you do though.  I believe you just have a UTI.

## 2018-07-07 NOTE — ED Notes (Signed)
Pt ambulatory to BR at this time with steady gait

## 2018-07-07 NOTE — ED Provider Notes (Addendum)
[facility] Emergency Department Provider Note   ____________________________________________   First MD Initiated Contact with Patient 07/07/18 [phone]     (approximate)  I have reviewed the triage vital signs and the nursing notes.   HISTORY  Chief Complaint Emesis and Fever    HPI Briana Jacobson is a 38 y.o. female patient comes in reporting a fever of 101 at home with nausea and vomiting.  She says she vomited 5 times.  There is no diarrhea.  Her son just had a stomach virus.  She also had a headache earlier that is got much better now is kind of coming and going but very mild at present.  She had a little bit of a nonproductive cough last night.  None at present.  She is not short of breath.  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     History reviewed. No pertinent past medical history.  There are no active problems to display for this patient.   History reviewed. No pertinent surgical history.  Prior to Admission medications   Medication Sig Start Date End Date Taking? Authorizing Provider  azithromycin (ZITHROMAX Z-PAK) 250 MG tablet 2 pills today then 1 pill a day for 4 days Patient not taking: Reported on 07/07/2018 05/01/18   Faythe Ghee, PA-C  cephALEXin (KEFLEX) 500 MG capsule Take 1 capsule (500 mg total) by mouth 3 (three) times daily for 10 days. 07/07/18 07/17/18  Arnaldo Natal, MD    Allergies Patient has no known allergies.  History reviewed. No pertinent family history.  Social History Social History   Tobacco Use  . Smoking status: Current Every Day Smoker    Packs/day: 0.50    Types: Cigarettes  . Smokeless tobacco: Never Used  Substance Use Topics  . Alcohol use: No  . Drug use: No    Review of Systems  Constitutional:  fever/chills Eyes: No visual changes. ENT: No sore throat. Cardiovascular: Denies chest pain. Respiratory: Denies shortness of breath. Gastrointestinal: No abdominal pain.    nausea, vomiting.  No diarrhea.  No constipation. Genitourinary: Negative for dysuria. Musculoskeletal: Negative for back pain. Skin: Negative for rash. Neurological: Negative for headaches, focal weakness   ____________________________________________   PHYSICAL EXAM:  VITAL SIGNS: ED Triage Vitals  Enc Vitals Group     BP 07/07/18 1901 (!) 152/86     Pulse Rate 07/07/18 1901 85     Resp 07/07/18 1901 18     Temp 07/07/18 1901 99.1 F (37.3 C)     Temp Source 07/07/18 1901 Oral     SpO2 07/07/18 1901 100 %     Weight 07/07/18 1902 197 lb (89.4 kg)     Height 07/07/18 1902 5\' 4"  (1.626 m)     Head Circumference --      Peak Flow --      Pain Score 07/07/18 1909 0     Pain Loc --      Pain Edu? --      Excl. in GC? --     Constitutional: Alert and oriented. Well appearing and in no acute distress. Eyes: Conjunctivae are normal.  Head: Atraumatic. Nose: No congestion/rhinnorhea. Mouth/Throat: Mucous membranes are moist.  Oropharynx non-erythematous. Neck: No stridor. Cardiovascular: Normal rate, regular rhythm. Grossly normal heart sounds.  Good peripheral circulation. Respiratory: Normal respiratory effort.  No retractions. Lungs CTAB. Gastrointestinal: Soft and nontender. No distention. No abdominal bruits. No CVA tenderness. Musculoskeletal: No lower extremity tenderness nor edema.  Neurologic:  Normal speech and language. No gross focal neurologic deficits are appreciated. Skin:  Skin is warm, dry and intact. No rash noted. Psychiatric: Mood and affect are normal. Speech and behavior are normal.  ____________________________________________   LABS (all labs ordered are listed, but only abnormal results are displayed)  Labs Reviewed  COMPREHENSIVE METABOLIC PANEL - Abnormal; Notable for the following components:      Result Value   Calcium 8.6 (*)    All other components within normal limits  URINALYSIS, COMPLETE (UACMP) WITH MICROSCOPIC - Abnormal; Notable  for the following components:   Color, Urine YELLOW (*)    APPearance CLEAR (*)    Hgb urine dipstick SMALL (*)    Leukocytes,Ua MODERATE (*)    All other components within normal limits  LIPASE, BLOOD  CBC WITH DIFFERENTIAL/PLATELET  POC URINE PREG, ED  POCT PREGNANCY, URINE   ____________________________________________  EKG   ____________________________________________  RADIOLOGY  ED MD interpretation: X-ray read by radiology reviewed by me shows no acute disease  Official radiology report(s): Dg Chest Portable 1 View  Result Date: 07/07/2018 CLINICAL DATA:  Fever. EXAM: PORTABLE CHEST 1 VIEW COMPARISON:  Radiographs of March 26, 2015. FINDINGS: The heart size and mediastinal contours are within normal limits. Both lungs are clear. The visualized skeletal structures are unremarkable. IMPRESSION: No active disease. Electronically Signed   By: Lupita RaiderJames  Green Jr, M.D.   On: 07/07/2018 19:41    ____________________________________________   PROCEDURES  Procedure(s) performed (including Critical Care):  Procedures   ____________________________________________   INITIAL IMPRESSION / ASSESSMENT AND PLAN / ED COURSE  Patient is feeling better at present.  I will give her some Keflex antibiotic for what appears to be UTI.  And some Zofran if she needs it.  Because of the coronavirus pandemic I will have her quarantine for at least 3 days after her fever resolved without antipyretics and at least 7 days since the beginning of the illness.  Will return if worse.             ____________________________________________   FINAL CLINICAL IMPRESSION(S) / ED DIAGNOSES  Final diagnoses:  Urinary tract infection with hematuria, site unspecified     ED Discharge Orders         Ordered    cephALEXin (KEFLEX) 500 MG capsule  3 times daily     07/07/18 2047           Note:  This document was prepared using Dragon voice recognition software and may include  unintentional dictation errors.    Arnaldo NatalMalinda, Madison Direnzo F, MD 07/07/18 2050    Arnaldo NatalMalinda, Anisten Tomassi F, MD 07/07/18 2050

## 2018-07-07 NOTE — ED Notes (Signed)
Peripheral IV discontinued. Catheter intact. No signs of infiltration or redness. Gauze applied to IV site.   Discharge instructions reviewed with patient. Questions fielded by this RN. Patient verbalizes understanding of instructions. Patient discharged home in stable condition per Dr Darnelle Catalan. No acute distress noted at time of discharge.

## 2018-07-07 NOTE — ED Notes (Signed)
X-ray at bedside

## 2018-07-07 NOTE — ED Notes (Signed)
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

## 2018-07-07 NOTE — ED Triage Notes (Addendum)
Patient reports fever at home of 101 at home and N/V - reports 5 emeses. Patient denies abdominal pain. Patient reports her son just recovered from a stomach virus. Patient denies diarrhea. Patient ibupofen at approx 1800. Patient reports dry cough earlier today. Patient also reports headache earlier today that has resolved.

## 2019-03-07 ENCOUNTER — Other Ambulatory Visit: Payer: Self-pay

## 2019-03-07 DIAGNOSIS — Z20822 Contact with and (suspected) exposure to covid-19: Secondary | ICD-10-CM

## 2019-03-08 LAB — NOVEL CORONAVIRUS, NAA: SARS-CoV-2, NAA: NOT DETECTED

## 2019-08-22 ENCOUNTER — Ambulatory Visit: Payer: Self-pay | Admitting: Physician Assistant

## 2019-08-22 ENCOUNTER — Encounter: Payer: Self-pay | Admitting: Physician Assistant

## 2019-08-22 ENCOUNTER — Other Ambulatory Visit: Payer: Self-pay

## 2019-08-22 DIAGNOSIS — Z113 Encounter for screening for infections with a predominantly sexual mode of transmission: Secondary | ICD-10-CM

## 2019-08-22 DIAGNOSIS — A5901 Trichomonal vulvovaginitis: Secondary | ICD-10-CM

## 2019-08-22 LAB — WET PREP FOR TRICH, YEAST, CLUE
Trichomonas Exam: POSITIVE — AB
Yeast Exam: NEGATIVE

## 2019-08-22 MED ORDER — METRONIDAZOLE 500 MG PO TABS: 2000.0000 mg | ORAL_TABLET | Freq: Once | ORAL | 0 refills | Status: AC

## 2019-08-22 NOTE — Progress Notes (Signed)
Wet mount reviewed and pt treated for Trich per standing order and per provider order. Provider orders completed.Lyman Speller, RN

## 2019-08-22 NOTE — Progress Notes (Signed)
Pt here for STD screening.Day Greb, RN 

## 2019-08-23 NOTE — Progress Notes (Signed)
  [facility] Department STI clinic/screening visit  Subjective:  Briana Jacobson is a 39 y.o. female being seen today for an STI screening visit. The patient reports they do not have symptoms.  Patient reports that they do not desire a pregnancy in the next year.   They reported they are not interested in discussing contraception today.  Patient's last menstrual period was 07/26/2019 (within days).   Patient has the following medical conditions:  There are no problems to display for this patient.   Chief Complaint  Patient presents with  . SEXUALLY TRANSMITTED DISEASE    STD screening    HPI  Patient reports that she is not having any symptoms but would like a screening.  States that she found out that her ex partner was treated as a contact to something but does not know what that was so she wants to be tested.  States LMP 07/26/2019 and is using the Nexplanon for [facility].  States that she last had a pap smear about 4 years ago, had a HIV test about then too and is currently trying to quit smoking.   See flowsheet for further details and programmatic requirements.    The following portions of the patient's history were reviewed and updated as appropriate: allergies, current medications, past medical history, past social history, past surgical history and problem list.  Objective:  There were no vitals filed for this visit.  Physical Exam Constitutional:      General: She is not in acute distress.    Appearance: Normal appearance.  HENT:     Head: Normocephalic and atraumatic.  Eyes:     Conjunctiva/sclera: Conjunctivae normal.  Pulmonary:     Effort: Pulmonary effort is normal.  Skin:    General: Skin is warm and dry.  Neurological:     Mental Status: She is alert and oriented to person, place, and time.  Psychiatric:        Mood and Affect: Mood normal.        Behavior: Behavior normal.        Thought Content: Thought content normal.        Judgment: Judgment  normal.   patient opts to self-collect vaginal samples for GC/Chlamydia and wet mount today.   Assessment and Plan:  Briana Jacobson is a 39 y.o. female presenting to the [facility] Department for STI screening  1. Screening for STD (sexually transmitted disease) Patient into clinic without symptoms. Rec condoms with all sex. Await test results.  Counseled that RN will call if needs to RTC for further treatment once results are back.  - WET PREP FOR TRICH, YEAST, CLUE - Chlamydia/Gonorrhea [place] [facility] - HIV [place] [facility] - Syphilis Serology, [place] [facility]  2. Trichomonal vaginitis Will treat for Trich with Metronidazole 2 g po with food, no EtOH for 24 hr before and until 72 hr after completing medicine.  No sex for 7 days and until after partner completes treatment. RTC for re-treatment if vomits < 2 hr after taking medicine. - metroNIDAZOLE (FLAGYL) 500 MG tablet; Take 4 tablets (2,000 mg total) by mouth once for 1 dose.  Dispense: 4 tablet; Refill: 0     Return for 2-3 weeks for TR's, 3 months TOC, and PRN.  Future Appointments  Date Time Provider Department Center  08/30/2019  9:00 AM [facility] PROVIDER [facility] None    Matt Holmes, PA

## 2019-08-30 ENCOUNTER — Ambulatory Visit: Payer: Self-pay

## 2019-12-25 ENCOUNTER — Ambulatory Visit: Payer: Self-pay

## 2020-01-21 ENCOUNTER — Ambulatory Visit: Payer: Self-pay

## 2020-01-22 ENCOUNTER — Ambulatory Visit: Payer: Managed Care, Other (non HMO)

## 2020-01-24 ENCOUNTER — Other Ambulatory Visit: Payer: Self-pay

## 2020-01-24 ENCOUNTER — Ambulatory Visit: Payer: Self-pay | Admitting: Physician Assistant

## 2020-01-24 ENCOUNTER — Encounter: Payer: Self-pay | Admitting: Physician Assistant

## 2020-01-24 DIAGNOSIS — Z202 Contact with and (suspected) exposure to infections with a predominantly sexual mode of transmission: Secondary | ICD-10-CM

## 2020-01-24 DIAGNOSIS — Z720 Tobacco use: Secondary | ICD-10-CM | POA: Insufficient documentation

## 2020-01-24 DIAGNOSIS — Z113 Encounter for screening for infections with a predominantly sexual mode of transmission: Secondary | ICD-10-CM

## 2020-01-24 LAB — WET PREP FOR TRICH, YEAST, CLUE
Trichomonas Exam: NEGATIVE
Yeast Exam: NEGATIVE

## 2020-01-24 MED ORDER — METRONIDAZOLE 500 MG PO TABS: 500.0000 mg | ORAL_TABLET | Freq: Three times a day (TID) | ORAL | 0 refills | Status: DC

## 2020-01-24 MED ORDER — METRONIDAZOLE 500 MG PO TABS: 500.0000 mg | ORAL_TABLET | Freq: Two times a day (BID) | ORAL | 0 refills | Status: AC

## 2020-01-24 NOTE — Progress Notes (Signed)
Patient here for STD testing and also for treatment as contact to Trich. Patient planning to make appointment to return for PE, Nexplanon removal/reinsertion. Last Pap 2016.Marland KitchenBurt Knack, RN

## 2020-01-24 NOTE — Progress Notes (Signed)
[facility] Department STI clinic/screening visit  Subjective:  Shaana Acocella is a 39 y.o. female being seen today for an STI screening visit. The patient reports they do have symptoms.  Patient reports that they do not desire a pregnancy in the next year.   They reported they are not interested in discussing contraception today.  Patient's last menstrual period was 01/03/2020 (exact date).Reports she has had neg Hep C testing.   Patient has the following medical conditions:  There are no problems to display for this patient.   Chief Complaint  Patient presents with  . Exposure to STD    HPI  Patient reports her female partner was recently dx with Trich. She reports vaginal discharge for 1-2 weeks.  Last HIV test per patient/review of record was 08/22/19. Patient reports last pap was 02/12/15.   See flowsheet for further details and programmatic requirements.    The following portions of the patient's history were reviewed and updated as appropriate: allergies, current medications, past medical history, past social history, past surgical history and problem list.  Objective:  There were no vitals filed for this visit.  Physical Exam Vitals and nursing note reviewed.  Constitutional:      Appearance: Normal appearance. She is obese.  HENT:     Head: Normocephalic and atraumatic.     Mouth/Throat:     Mouth: Mucous membranes are moist.     Pharynx: Oropharynx is clear. No oropharyngeal exudate or posterior oropharyngeal erythema.  Pulmonary:     Effort: Pulmonary effort is normal.  Abdominal:     General: Abdomen is flat.     Palpations: There is no mass.     Tenderness: There is no abdominal tenderness. There is no rebound.  Genitourinary:    General: Normal vulva.     Exam position: Lithotomy position.     Pubic Area: No rash or pubic lice.      Labia:        Right: No rash or lesion.        Left: No rash or lesion.      Vagina: Normal. No vaginal  discharge, erythema, bleeding or lesions.     Cervix: No cervical motion tenderness, discharge, friability, lesion or erythema.     Uterus: Normal.      Adnexa: Right adnexa normal and left adnexa normal.     Rectum: Normal.     Comments: Vag pH < 4.5 Lymphadenopathy:     Head:     Right side of head: No preauricular or posterior auricular adenopathy.     Left side of head: No preauricular or posterior auricular adenopathy.     Cervical: No cervical adenopathy.     Upper Body:     Right upper body: No supraclavicular or axillary adenopathy.     Left upper body: No supraclavicular or axillary adenopathy.     Lower Body: No right inguinal adenopathy. No left inguinal adenopathy.  Skin:    General: Skin is warm and dry.     Findings: No rash.  Neurological:     Mental Status: She is alert and oriented to person, place, and time.      Assessment and Plan:  Lorane Cousar is a 39 y.o. female presenting to the [facility] Department for STI screening  1. Routine screening for STI (sexually transmitted infection) Treat wet prep per S.O. - HIV [place] [facility] - Syphilis Serology, [place] [facility] - WET PREP FOR TRICH, YEAST, CLUE -  Chlamydia/Gonorrhea [place] [facility]  2. Trichomonas contact Metronidazole 500mg  po bid for 7 days.  3. Tobacco use Pt states she smoked her last cigarette today, has been smoking 10 cpd for 3 years. Counseled cessation via 5 As. [place] [facility] referral done.  4. RTC 1 mo for annual well woman exam and Nexplanon removal/reinsertion.  5. Pt has not had COVID vaccine - desires today.     Return in about 1 month (around 02/24/2020) for Annual well-woman exam.  No future appointments.  02/26/2020, PA-C

## 2020-01-24 NOTE — Progress Notes (Signed)
Patient wet mount reviewed, no treatment indicated. Patient treated as contact to Trich per SO. Patient sent to clerical to schedule PE with nexplanon removal/reinsertion, and then taken to covid vaccine clinic.Marland KitchenBurt Knack, RN

## 2020-01-29 ENCOUNTER — Ambulatory Visit: Payer: Self-pay

## 2020-02-12 ENCOUNTER — Telehealth: Payer: Self-pay

## 2020-02-12 NOTE — Telephone Encounter (Signed)
Call to patient to inform of positive gonorrhea results from 01/24/2020. Busy signal unable to leave message.   Harvie Heck, RN

## 2020-02-13 ENCOUNTER — Telehealth: Payer: Self-pay

## 2020-02-13 NOTE — Telephone Encounter (Signed)
Call to patient contact, [facility] Xxx for patient to return call.   Harvie Heck, RN

## 2020-02-13 NOTE — Telephone Encounter (Signed)
Patient phone line busy, needs tx for gonorrhea. Will send letter to address on file.   Harvie Heck, RN

## 2020-02-18 ENCOUNTER — Ambulatory Visit: Payer: Managed Care, Other (non HMO) | Admitting: Advanced Practice Midwife

## 2020-02-18 ENCOUNTER — Other Ambulatory Visit: Payer: Self-pay

## 2020-02-18 DIAGNOSIS — Z113 Encounter for screening for infections with a predominantly sexual mode of transmission: Secondary | ICD-10-CM

## 2020-02-18 DIAGNOSIS — A549 Gonococcal infection, unspecified: Secondary | ICD-10-CM

## 2020-02-18 MED ORDER — CEFTRIAXONE SODIUM 500 MG IJ SOLR
500.0000 mg | Freq: Once | INTRAMUSCULAR | Status: AC
  Administered 2020-02-18: 500 mg via INTRAMUSCULAR

## 2020-02-18 NOTE — Progress Notes (Signed)
  [facility] Department STI clinic/screening visit  Subjective:  Briana Jacobson is a 39 y.o. SBF G8P6 smoker female being seen today for an treatment of GC from 01/24/20 appt here only. The patient reports they do not have symptoms.  Patient reports that they do not desire a pregnancy in the next year.   They reported they are not interested in discussing contraception today.  Patient's last menstrual period was 02/01/2020.   Patient has the following medical conditions:   Patient Active Problem List   Diagnosis Date Noted  . Gonorrhea 01/24/20 02/18/2020  . Tobacco abuse 01/24/2020    No chief complaint on file.   HPI  Patient reports here because received letter stating she has GC from 01/24/20 apt and wants tx only  Last HIV test per patient/review of record was 01/24/20 Patient reports last pap was 02/12/15 neg HPV neg  See flowsheet for further details and programmatic requirements.    The following portions of the patient's history were reviewed and updated as appropriate: allergies, current medications, past medical history, past social history, past surgical history and problem list.  Objective:  There were no vitals filed for this visit.  Physical Exam  Pt declines Assessment and Plan:  Briana Jacobson is a 39 y.o. female presenting to the [facility] Department for STI screening  1. Screening examination for venereal disease Pt refuses history or testing and states she received a letter and just wants her treatment for GC today  2. Gonorrhea 01/24/20 Please treat per standing orders for GC     No follow-ups on file.  No future appointments.  Alberteen Spindle, CNM

## 2020-02-18 NOTE — Progress Notes (Signed)
Patient here for treatment for Gonorrhea. Patient treated per SO, and states partner already treated. Counseled to stay for observation for 20 minutes, patient agrees.Burt Knack, RN

## 2020-04-23 ENCOUNTER — Ambulatory Visit: Payer: Medicaid Other

## 2020-09-26 ENCOUNTER — Encounter: Payer: Self-pay | Admitting: Emergency Medicine

## 2020-09-26 ENCOUNTER — Other Ambulatory Visit: Payer: Self-pay

## 2020-09-26 ENCOUNTER — Emergency Department
Admission: EM | Admit: 2020-09-26 | Discharge: 2020-09-26 | Disposition: A | Discharge status: Home / Self Care | Payer: 59 | Attending: Emergency Medicine | Admitting: Emergency Medicine

## 2020-09-26 ENCOUNTER — Emergency Department: Payer: 59

## 2020-09-26 DIAGNOSIS — Y92410 Unspecified street and highway as the place of occurrence of the external cause: Secondary | ICD-10-CM | POA: Insufficient documentation

## 2020-09-26 DIAGNOSIS — M545 Low back pain, unspecified: Secondary | ICD-10-CM | POA: Diagnosis not present

## 2020-09-26 DIAGNOSIS — F17210 Nicotine dependence, cigarettes, uncomplicated: Secondary | ICD-10-CM | POA: Diagnosis not present

## 2020-09-26 LAB — POC URINE PREG, ED: Preg Test, Ur: NEGATIVE

## 2020-09-26 MED ORDER — KETOROLAC TROMETHAMINE 30 MG/ML IJ SOLN
60.0000 mg | Freq: Once | INTRAMUSCULAR | Status: AC
  Administered 2020-09-26: 08:00:00 60 mg via INTRAMUSCULAR
  Filled 2020-09-26: qty 2

## 2020-09-26 MED ORDER — IBUPROFEN 800 MG PO TABS: 800.0000 mg | ORAL_TABLET | Freq: Three times a day (TID) | ORAL | 0 refills | Status: DC | PRN

## 2020-09-26 MED ORDER — METHOCARBAMOL 500 MG PO TABS: 500.0000 mg | ORAL_TABLET | Freq: Three times a day (TID) | ORAL | 0 refills | Status: DC | PRN
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
## 2020-09-26 NOTE — ED Notes (Signed)
Patient provided a urine cup to obtain sample for urine preg

## 2020-09-26 NOTE — ED Notes (Signed)
Pt return from xray.
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
## 2020-09-26 NOTE — Discharge Instructions (Addendum)
You may alternate Tylenol 1000 mg every 6 hours as needed for pain, fever and Ibuprofen 800 mg every 8 hours as needed for pain, fever.  Please take Ibuprofen with food.  Do not take more than 4000 mg of Tylenol (acetaminophen) in a 24 hour period.   Steps to find a Primary Care Provider (PCP):  Call [phone] or [phone] to access "[place] Find a Doctor Service."  2.  You may also go on the [facility] website at [email or web address]  

## 2020-09-26 NOTE — ED Notes (Signed)
ED Provider at bedside. 

## 2020-09-26 NOTE — ED Notes (Signed)
Pt transported to xray 

## 2021-01-29 ENCOUNTER — Ambulatory Visit: Payer: Medicaid Other

## 2021-01-29 ENCOUNTER — Ambulatory Visit: Payer: 59

## 2021-02-11 ENCOUNTER — Other Ambulatory Visit: Payer: Self-pay

## 2021-02-11 ENCOUNTER — Encounter: Payer: Self-pay | Admitting: Family Medicine

## 2021-02-11 ENCOUNTER — Ambulatory Visit: Payer: 59 | Admitting: Family Medicine

## 2021-02-11 DIAGNOSIS — A5901 Trichomonal vulvovaginitis: Secondary | ICD-10-CM

## 2021-02-11 DIAGNOSIS — Z202 Contact with and (suspected) exposure to infections with a predominantly sexual mode of transmission: Secondary | ICD-10-CM

## 2021-02-11 DIAGNOSIS — Z113 Encounter for screening for infections with a predominantly sexual mode of transmission: Secondary | ICD-10-CM | POA: Diagnosis not present

## 2021-02-11 LAB — WET PREP FOR TRICH, YEAST, CLUE
Trichomonas Exam: POSITIVE — AB
Yeast Exam: NEGATIVE

## 2021-02-11 MED ORDER — CEFTRIAXONE SODIUM 500 MG IJ SOLR
500.0000 mg | Freq: Once | INTRAMUSCULAR | Status: AC
Start: 2021-02-11 — End: 2021-02-11
  Administered 2021-02-11: 500 mg via INTRAMUSCULAR

## 2021-02-11 MED ORDER — DOXYCYCLINE HYCLATE 100 MG PO TABS: 100.0000 mg | ORAL_TABLET | Freq: Two times a day (BID) | ORAL | 0 refills | Status: AC

## 2021-02-11 MED ORDER — METRONIDAZOLE 500 MG PO TABS: 500.0000 mg | ORAL_TABLET | Freq: Two times a day (BID) | ORAL | 0 refills | Status: AC

## 2021-02-11 NOTE — Progress Notes (Signed)
[facility] Department STI clinic/screening visit  Subjective:  Briana Jacobson is a 40 y.o. female being seen today for an STI screening visit. The patient reports they do have symptoms.  Patient reports that they do not desire a pregnancy in the next year.   They reported they are not interested in discussing contraception today.  Patient's last menstrual period was 01/17/2021 (exact date).   Patient has the following medical conditions:   Patient Active Problem List   Diagnosis Date Noted   Gonorrhea 01/24/20 02/18/2020   Tobacco abuse 01/24/2020    Chief Complaint  Patient presents with   SEXUALLY TRANSMITTED DISEASE    Screening and BC options, Nexplanon old    HPI  Patient reports her for screening, is a contact to gonorrhea, pt does have s/sx.    Last HIV test per patient/review of record was 01/24/2020 Patient reports last pap was 2016.   See flowsheet for further details and programmatic requirements.    The following portions of the patient's history were reviewed and updated as appropriate: allergies, current medications, past medical history, past social history, past surgical history and problem list.  Objective:  There were no vitals filed for this visit.  Physical Exam Vitals and nursing note reviewed.  Constitutional:      Appearance: Normal appearance.  HENT:     Head: Normocephalic and atraumatic.     Mouth/Throat:     Mouth: Mucous membranes are moist.     Pharynx: Oropharynx is clear. No oropharyngeal exudate or posterior oropharyngeal erythema.  Pulmonary:     Effort: Pulmonary effort is normal.  Abdominal:     General: Abdomen is flat.     Palpations: There is no mass.     Tenderness: There is no abdominal tenderness. There is no rebound.  Genitourinary:    General: Normal vulva.     Exam position: Lithotomy position.     Pubic Area: No rash or pubic lice.      Labia:        Right: No rash or lesion.        Left: No rash or  lesion.      Vagina: Normal. No vaginal discharge, erythema, bleeding or lesions.     Cervix: No cervical motion tenderness, discharge, friability, lesion or erythema.     Uterus: Normal.      Adnexa: Right adnexa normal and left adnexa normal.     Rectum: Normal.     Comments: External genitalia without, lice, nits, erythema, edema , lesions or inguinal adenopathy. Vagina with normal mucosa and discharge, odor and pH > 4.  Cervix without visual lesions, uterus firm, mobile, non-tender, no masses, CMT adnexal fullness or tenderness.   Musculoskeletal:     Cervical back: Normal range of motion.  Lymphadenopathy:     Head:     Right side of head: No preauricular or posterior auricular adenopathy.     Left side of head: No preauricular or posterior auricular adenopathy.     Cervical: No cervical adenopathy.     Upper Body:     Right upper body: No supraclavicular or axillary adenopathy.     Left upper body: No supraclavicular or axillary adenopathy.     Lower Body: No right inguinal adenopathy. No left inguinal adenopathy.  Skin:    General: Skin is warm and dry.     Findings: No rash.  Neurological:     Mental Status: She is alert and oriented to person, place, and time.  Psychiatric:        Mood and Affect: Mood normal.        Behavior: Behavior normal.     Assessment and Plan:  Briana Jacobson is a 40 y.o. female presenting to the [facility] Department for STI screening  1. Screening examination for venereal disease Patient accepted all screenings including wet prep, vaginal CT/GC and bloodwork for HIV/RPR.  Patient meets criteria for HepB screening? No. Ordered? No - does not meet  Patient meets criteria for HepC screening? No. Ordered? No - does not meet criteria   Wet prep results + Trichomonas    Treatment needed for Trich.    Discussed time line for State Lab results and that patient will be called with positive results and encouraged patient to call if she had  not heard in 2 weeks.  Counseled to return or seek care for continued or worsening symptoms Recommended condom use with all sex  Patient is currently using *Nexplanon to prevent pregnancy.  Reports it time for nexplanon to be removed.  Pt to schedule appointment for removal.  - Chlamydia/Gonorrhea [facility] - WET PREP FOR TRICH, YEAST, CLUE  2. Exposure to gonorrhea Treated as contact  - cefTRIAXone (ROCEPHIN) injection 500 mg - doxycycline (VIBRA-TABS) 100 MG tablet; Take 1 tablet (100 mg total) by mouth 2 (two) times daily for 7 days.  Dispense: 14 tablet; Refill: 0  3. Trichomonas vaginitis  - metroNIDAZOLE (FLAGYL) 500 MG tablet; Take 1 tablet (500 mg total) by mouth 2 (two) times daily for 7 days.  Dispense: 14 tablet; Refill: 0   No follow-ups on file.  Future Appointments  Date Time Provider Department Center  03/25/2021  1:20 PM Larae Grooms, NP [facility]    Wendi Snipes, FNP

## 2021-02-11 NOTE — Progress Notes (Signed)
Patient seen for STD screening. Wet mount reviewed by provider, tx given and documented. Condoms given. Contact cards given.

## 2021-02-25 ENCOUNTER — Ambulatory Visit: Payer: 59

## 2021-02-25 ENCOUNTER — Ambulatory Visit: Payer: Medicaid Other

## 2021-03-09 ENCOUNTER — Ambulatory Visit: Payer: 59

## 2021-03-25 ENCOUNTER — Ambulatory Visit: Payer: Medicaid Other | Admitting: Nurse Practitioner

## 2021-03-25 NOTE — Progress Notes (Deleted)
° °  There were no vitals taken for this visit.   Subjective:    Patient ID: Briana Jacobson, female    DOB: 07/24/1980, 40 y.o.   MRN: [id number]  HPI: Briana Jacobson is a 40 y.o. female  No chief complaint on file.  Patient presents to clinic to establish care with new PCP.  Introduced to Publishing rights manager role and practice setting.  All questions answered.  Discussed provider/patient relationship and expectations.  Patient reports a history of ***. Patient denies a history of: Hypertension, Elevated Cholesterol, Diabetes, Thyroid problems, Depression, Anxiety, Neurological problems, and Abdominal problems.   Active Ambulatory Problems    Diagnosis Date Noted   Tobacco abuse 01/24/2020   Gonorrhea 01/24/20 02/18/2020   Resolved Ambulatory Problems    Diagnosis Date Noted   No Resolved Ambulatory Problems   No Additional Past Medical History   No past surgical history on file. Family History  Problem Relation Age of Onset   Hypertension Mother    Ovarian cysts Mother    Hypertension Father    Asthma Son    Alzheimer's disease Maternal Grandmother    Alzheimer's disease Paternal Grandmother      Review of Systems  Per HPI unless specifically indicated above     Objective:    There were no vitals taken for this visit.  Wt Readings from Last 3 Encounters:  09/26/20 223 lb (101.2 kg)  02/12/15 196 lb 3.2 oz (89 kg)  07/07/18 197 lb (89.4 kg)    Physical Exam  Results for orders placed or performed in visit on 02/11/21  WET PREP FOR TRICH, YEAST, CLUE  Result Value Ref Range   Trichomonas Exam Positive (A) Negative   Yeast Exam Negative Negative   Clue Cell Exam Comment: Negative      Assessment & Plan:   Problem List Items Addressed This Visit   None Visit Diagnoses     Encounter to establish care    -  Primary        Follow up plan: No follow-ups on file.

## 2021-04-09 ENCOUNTER — Ambulatory Visit: Payer: Medicaid Other

## 2021-04-09 ENCOUNTER — Ambulatory Visit: Payer: 59

## 2021-04-20 ENCOUNTER — Ambulatory Visit: Payer: Medicaid Other

## 2021-04-20 ENCOUNTER — Ambulatory Visit: Payer: 59

## 2021-05-11 ENCOUNTER — Ambulatory Visit: Payer: 59

## 2021-07-24 ENCOUNTER — Ambulatory Visit: Payer: Self-pay | Admitting: Family Medicine

## 2021-07-24 ENCOUNTER — Encounter: Payer: Self-pay | Admitting: Family Medicine

## 2021-07-24 DIAGNOSIS — A549 Gonococcal infection, unspecified: Secondary | ICD-10-CM

## 2021-07-24 DIAGNOSIS — Z299 Encounter for prophylactic measures, unspecified: Secondary | ICD-10-CM

## 2021-07-24 DIAGNOSIS — Z113 Encounter for screening for infections with a predominantly sexual mode of transmission: Secondary | ICD-10-CM

## 2021-07-24 LAB — WET PREP FOR TRICH, YEAST, CLUE: Trichomonas Exam: NEGATIVE

## 2021-07-24 MED ORDER — CEFTRIAXONE SODIUM 500 MG IJ SOLR
500.0000 mg | Freq: Once | INTRAMUSCULAR | Status: AC
  Administered 2021-07-24: 500 mg via INTRAMUSCULAR

## 2021-07-24 MED ORDER — FLUCONAZOLE 150 MG PO TABS: 150.0000 mg | ORAL_TABLET | Freq: Once | ORAL | 0 refills | Status: AC

## 2021-07-24 MED ORDER — DOXYCYCLINE HYCLATE 100 MG PO TABS: 100.0000 mg | ORAL_TABLET | Freq: Two times a day (BID) | ORAL | 0 refills | Status: AC

## 2021-07-24 NOTE — Progress Notes (Signed)
Pt here for STD screening and as a contact to Gonorrhea.  Wet prep results reviewed.  Medication dispensed and Ceftriaxone 500 mg given IM without any complications.  Pt declined condoms.  Windle Guard, RN ? ?

## 2021-07-24 NOTE — Progress Notes (Signed)
[facility] Department ? ?STI clinic/screening visit ?[address] ?[place] [address] ?[phone] ? ?Subjective:  ?Briana Jacobson is a 41 y.o. female being seen today for an STI screening visit. The patient reports they do have symptoms.  Patient reports that they do not desire a pregnancy in the next year.   They reported they are not interested in discussing contraception today.   ? ?Patient's last menstrual period was 07/11/2021 (approximate). ? ? ?Patient has the following medical conditions:   ?Patient Active Problem List  ? Diagnosis Date Noted  ? Gonorrhea 01/24/20 02/18/2020  ? Tobacco abuse 01/24/2020  ? ? ?Chief Complaint  ?Patient presents with  ? SEXUALLY TRANSMITTED DISEASE  ?  Screening and treatment for Gonorrhea  ? ? ?HPI ? ?Patient reports contact to gonorrhea  ? ?Last HIV test per patient/review of record was 12/2019 ?Patient reports last pap was 2016.  ? ?Screening for MPX risk: ?Does the patient have an unexplained rash? No ?Is the patient MSM? No ?Does the patient endorse multiple sex partners or anonymous sex partners? No ?Did the patient have close or sexual contact with a person diagnosed with MPX? No ?Has the patient traveled outside the [place] where MPX is endemic? No ?Is there a high clinical suspicion for MPX-- evidenced by one of the following No ? -Unlikely to be chickenpox ? -Lymphadenopathy ? -Rash that present in same phase of evolution on any given body part ?See flowsheet for further details and programmatic requirements.  ? ? ?The following portions of the patient's history were reviewed and updated as appropriate: allergies, current medications, past medical history, past social history, past surgical history and problem list. ? ?Objective:  ?There were no vitals filed for this visit. ? ?Physical Exam ?Vitals and nursing note reviewed.  ?Constitutional:   ?   Appearance: Normal appearance.  ?HENT:  ?   Head: Normocephalic and atraumatic.  ?   Mouth/Throat:   ?   Mouth: Mucous membranes are moist.  ?   Pharynx: Oropharynx is clear. No oropharyngeal exudate or posterior oropharyngeal erythema.  ?Pulmonary:  ?   Effort: Pulmonary effort is normal.  ?Abdominal:  ?   General: Abdomen is flat.  ?   Palpations: There is no mass.  ?   Tenderness: There is no abdominal tenderness. There is no rebound.  ?Genitourinary: ?   Exam position: Lithotomy position.  ?   Pubic Area: No rash or pubic lice.   ?   Labia:     ?   Right: No rash or lesion.     ?   Left: No rash or lesion.   ?   Vagina: Normal. No vaginal discharge, erythema, bleeding or lesions.  ?   Cervix: No cervical motion tenderness, discharge, friability, lesion or erythema.  ?   Uterus: Normal.   ?   Adnexa: Right adnexa normal and left adnexa normal.  ?   Comments: Exam deferred.  Patient elected to self collect.  ?Lymphadenopathy:  ?   Head:  ?   Right side of head: No preauricular or posterior auricular adenopathy.  ?   Left side of head: No preauricular or posterior auricular adenopathy.  ?   Cervical: No cervical adenopathy.  ?   Upper Body:  ?   Right upper body: No supraclavicular or axillary adenopathy.  ?   Left upper body: No supraclavicular or axillary adenopathy.  ?   Lower Body: No right inguinal adenopathy. No left inguinal adenopathy.  ?  Skin: ?   General: Skin is warm and dry.  ?   Findings: No rash.  ?Neurological:  ?   Mental Status: She is alert and oriented to person, place, and time.  ?Psychiatric:     ?   Mood and Affect: Mood normal.     ?   Behavior: Behavior normal.  ? ? ? ?Assessment and Plan:  ?Briana Jacobson is a 41 y.o. female presenting to the [facility] Department for STI screening ? ?1. Screening examination for venereal disease ?Patient accepted all screenings including wet prep, vaginal CT/GC and declined bloodwork for HIV/RPR.  ?Patient meets criteria for HepB screening? No. Ordered? No -   ?Patient meets criteria for HepC screening? No. Ordered? No -   ? ?Wet prep results   yeast   ?Treatment needed  ?Discussed time line for State Lab results and that patient will be called with positive results and encouraged patient to call if she had not heard in 2 weeks.  ?Counseled to return or seek care for continued or worsening symptoms ?Recommended condom use with all sex ? ?Patient is currently using *Nexplanon to prevent pregnancy.  ? ?- Chlamydia/Gonorrhea [place] [facility] ?- WET PREP FOR TRICH, YEAST, CLUE ? ?2. Gonorrhea ?Exposure to gonorrhea  ?- cefTRIAXone (ROCEPHIN) injection 500 mg ? ?3. Prophylactic measure ? ?- fluconazole (DIFLUCAN) 150 MG tablet; Take 1 tablet (150 mg total) by mouth once for 1 dose.  Dispense: 1 tablet; Refill: 0 ?- doxycycline (VIBRA-TABS) 100 MG tablet; Take 1 tablet (100 mg total) by mouth 2 (two) times daily for 7 days.  Dispense: 14 tablet; Refill: 0 ? ? ? ? ?Return for as needed. ? ?No future appointments. ? ?Wendi Snipes, FNP ?

## 2021-07-31 ENCOUNTER — Ambulatory Visit: Payer: 59

## 2021-08-04 ENCOUNTER — Telehealth: Payer: Self-pay

## 2021-08-04 NOTE — Telephone Encounter (Signed)
Received phone call back from pt, and pt confirmed password from last visit. Counseled pt regarding results, already received tx, TOC, and partner eval/tx as mentioned below. Answered pt questions.  ?

## 2021-08-04 NOTE — Telephone Encounter (Signed)
Calling pt regarding positive gonorrhea result from 07/24/21 vaginal specimen. Inform pt of result, tx received at 07/24/21 due to contact to [facility], RTC in 3 months for TOC, partners to be evaluated and treated properly. ? ?Phone call to pt. Left message on voicemail that RN with [facility] is calling regarding TR. Please call [facility] at [phone].  ? ?Also sent MyChart message. ?

## 2021-10-28 ENCOUNTER — Ambulatory Visit: Payer: 59

## 2021-11-01 ENCOUNTER — Other Ambulatory Visit: Payer: Self-pay

## 2021-11-01 ENCOUNTER — Encounter: Payer: Self-pay | Admitting: Emergency Medicine

## 2021-11-01 ENCOUNTER — Emergency Department
Admission: EM | Admit: 2021-11-01 | Discharge: 2021-11-01 | Disposition: A | Discharge status: Home / Self Care | Payer: 59 | Attending: Emergency Medicine | Admitting: Emergency Medicine

## 2021-11-01 DIAGNOSIS — N390 Urinary tract infection, site not specified: Secondary | ICD-10-CM | POA: Insufficient documentation

## 2021-11-01 DIAGNOSIS — M545 Low back pain, unspecified: Secondary | ICD-10-CM | POA: Diagnosis present

## 2021-11-01 LAB — URINALYSIS, ROUTINE W REFLEX MICROSCOPIC
Bilirubin Urine: NEGATIVE
Glucose, UA: NEGATIVE mg/dL
Ketones, ur: NEGATIVE mg/dL
Nitrite: NEGATIVE
Protein, ur: 100 mg/dL — AB
Specific Gravity, Urine: 1.01 (ref 1.005–1.030)
pH: 5 (ref 5.0–8.0)

## 2021-11-01 LAB — PREGNANCY, URINE: Preg Test, Ur: NEGATIVE

## 2021-11-01 MED ORDER — CEFDINIR 300 MG PO CAPS: 300.0000 mg | ORAL_CAPSULE | Freq: Two times a day (BID) | ORAL | 0 refills | Status: AC

## 2021-11-01 NOTE — Discharge Instructions (Signed)
Follow-up with your  primary care provider if any continued problems or urgent care.  Taking antibiotics until completely finished.  Increase fluids to stay hydrated and also dilute your urine.  If any severe worsening of your symptoms such as fever, chills, severe back pain or worsening of your symptoms return to the emergency department.

## 2021-11-01 NOTE — ED Notes (Signed)
Woke up yesterday with pain on R side.....Marland Kitchenthen radiating to back later. No pain is on L side.

## 2021-11-01 NOTE — ED Triage Notes (Signed)
Pt reports lower back pain since yesterday. Pt states started on the right side and as she finished work the pain has spread across her lower back. Pt reports pain gets worse with walking. Pt reports has some urinary frequency but denies fevers, heavy lifting or other possible injuries.

## 2021-11-01 NOTE — ED Provider Notes (Signed)
[facility] Provider Note    Event Date/Time   First MD Initiated Contact with Patient 11/01/21 [phone]     (approximate)   History   Back Pain   HPI  Briana Jacobson is a 41 y.o. female   presents to the ED with complaint of low back pain that started yesterday with some urinary frequency.  Patient denies any fever, chills, nausea or vomiting.  Patient denies any injury to her back.  No previous kidney stones.      Physical Exam   Triage Vital Signs: ED Triage Vitals  Enc Vitals Group     BP 11/01/21 0758 132/86     Pulse Rate 11/01/21 0757 85     Resp 11/01/21 0757 20     Temp 11/01/21 0757 99.6 F (37.6 C)     Temp Source 11/01/21 0757 Oral     SpO2 11/01/21 0757 100 %     Weight 11/01/21 0756 220 lb (99.8 kg)     Height 11/01/21 0756 5\' 5"  (1.651 m)     Head Circumference --      Peak Flow --      Pain Score 11/01/21 0756 5     Pain Loc --      Pain Edu? --      Excl. in GC? --     Most recent vital signs: Vitals:   11/01/21 0942 11/01/21 1003  BP: 130/80 130/80  Pulse: 80 78  Resp: 18 18  Temp:  99 F (37.2 C)  SpO2: 100% 100%     General: Awake, no distress.  CV:  Good peripheral perfusion.  Resp:  Normal effort.  Abd:  No distention.  Other:     ED Results / Procedures / Treatments   Labs (all labs ordered are listed, but only abnormal results are displayed) Labs Reviewed  URINALYSIS, ROUTINE W REFLEX MICROSCOPIC - Abnormal; Notable for the following components:      Result Value   Color, Urine YELLOW (*)    APPearance HAZY (*)    Hgb urine dipstick LARGE (*)    Protein, ur 100 (*)    Leukocytes,Ua LARGE (*)    Bacteria, UA RARE (*)    All other components within normal limits  PREGNANCY, URINE       PROCEDURES:  Critical Care performed:   Procedures   MEDICATIONS ORDERED IN ED: Medications - No data to display   IMPRESSION / MDM / ASSESSMENT AND PLAN / ED COURSE  I reviewed the triage vital signs  and the nursing notes.   Differential diagnosis includes, but is not limited to, kidney stones, urinary tract infection, muscle skeletal pain.  41 year old female presents to the ED with complaint of urinary frequency and low back pain that started yesterday.  She denies any nausea, vomiting, fever or chills.  Urinalysis is consistent with a urinary tract infection.  Pregnancy test was negative and patient had 6-10 RBCs with 21-50 WBCs and WBCs in clumps.  Patient was started on Omnicef 300 mg twice daily for 10 days.  She is encouraged to increase fluids to stay hydrated and Tylenol if needed for any fever and for back pain.  Patient is to follow-up with her PCP if any continued problems or concerns.      Patient's presentation is most consistent with acute complicated illness / injury requiring diagnostic workup.  FINAL CLINICAL IMPRESSION(S) / ED DIAGNOSES   Final diagnoses:  Acute urinary tract infection  Rx / DC Orders   ED Discharge Orders          Ordered    cefdinir (OMNICEF) 300 MG capsule  2 times daily        11/01/21 0956             Note:  This document was prepared using Dragon voice recognition software and may include unintentional dictation errors.   Tommi Rumps, PA-C 11/01/21 1053    Concha Se, MD 11/01/21 [phone]

## 2022-04-29 ENCOUNTER — Ambulatory Visit (LOCAL_COMMUNITY_HEALTH_CENTER): Payer: 59 | Admitting: Family Medicine

## 2022-04-29 ENCOUNTER — Encounter: Payer: Self-pay | Admitting: Family Medicine

## 2022-04-29 VITALS — BP 107/76 | Ht 65.0 in | Wt 220.8 lb

## 2022-04-29 DIAGNOSIS — Z3009 Encounter for other general counseling and advice on contraception: Secondary | ICD-10-CM | POA: Diagnosis not present

## 2022-04-29 DIAGNOSIS — Z01419 Encounter for gynecological examination (general) (routine) without abnormal findings: Secondary | ICD-10-CM | POA: Diagnosis not present

## 2022-04-29 DIAGNOSIS — Z3046 Encounter for surveillance of implantable subdermal contraceptive: Secondary | ICD-10-CM

## 2022-04-29 DIAGNOSIS — Z113 Encounter for screening for infections with a predominantly sexual mode of transmission: Secondary | ICD-10-CM

## 2022-04-29 LAB — WET PREP FOR TRICH, YEAST, CLUE
Trichomonas Exam: NEGATIVE
Yeast Exam: NEGATIVE

## 2022-04-29 LAB — HM HIV SCREENING LAB: HM HIV Screening: NEGATIVE

## 2022-04-29 NOTE — Progress Notes (Signed)
[facility] Number: [phone]  Family Planning Visit- Initial Visit  Subjective:  Briana Jacobson is a 42 y.o.  G6P0   being seen today for an initial annual visit and to discuss reproductive life planning.  The patient is currently using Hormonal Implant for pregnancy prevention. Patient reports   does not want a pregnancy in the next year.     report they are looking for a method that provides Other partner has vasectomy  Patient has the following medical conditions has Tobacco abuse and Gonorrhea 01/24/20 on their problem list.  Chief Complaint  Patient presents with   Contraception    Nexplanon removal     Patient reports to clinic for PE, pap, and nexplanon removal  Patient denies concerns about self   Body mass index is 36.74 kg/m. - Patient is eligible for diabetes screening based on BMI> 25 and age >35?  yes HA1C ordered? No- practitioner oversight- forgot to order a1c  Patient reports 1  partner/s in last year. Desires STI screening?  Yes  Has patient been screened once for HCV in the past?  No  No results found for: "HCVAB"  Does the patient have current drug use (including MJ), have a partner with drug use, and/or has been incarcerated since last result? No  If yes-- Screen for HCV through [facility]   Does the patient meet criteria for HBV testing? No  Criteria:  -Household, sexual or needle sharing contact with HBV -History of drug use -HIV positive -Those with known Hep C   Health Maintenance Due  Topic Date Due   Hepatitis C Screening  Never done   PAP SMEAR-Modifier  02/12/2020   INFLUENZA VACCINE  Never done   COVID-19 Vaccine (2 - 2023-24 season) 11/27/2021    Review of Systems  Constitutional:  Negative for weight loss.  Eyes:  Negative for blurred vision.  Respiratory:  Negative for cough and shortness of breath.   Cardiovascular:  Negative for claudication.   Gastrointestinal:  Negative for nausea.  Genitourinary:  Negative for dysuria and frequency.  Skin:  Negative for rash.  Neurological:  Negative for headaches.  Endo/Heme/Allergies:  Does not bruise/bleed easily.    The following portions of the patient's history were reviewed and updated as appropriate: allergies, current medications, past family history, past medical history, past social history, past surgical history and problem list. Problem list updated.   See flowsheet for other program required questions.  Objective:   Vitals:   04/29/22 1508  BP: 107/76  Weight: 220 lb 12.8 oz (100.2 kg)  Height: 5\' 5"  (1.651 m)    Physical Exam Vitals and nursing note reviewed.  Constitutional:      Appearance: She is obese.  HENT:     Head: Normocephalic and atraumatic.     Mouth/Throat:     Mouth: Mucous membranes are moist.     Pharynx: Oropharynx is clear. No oropharyngeal exudate or posterior oropharyngeal erythema.  Cardiovascular:     Rate and Rhythm: Normal rate and regular rhythm.  Pulmonary:     Effort: Pulmonary effort is normal.  Chest:  Breasts:    Tanner Score is 5.     Right: Normal. No mass, nipple discharge, skin change or tenderness.     Left: Normal. No mass, nipple discharge, skin change or tenderness.  Abdominal:     Palpations: Abdomen is soft. There is no mass.     Tenderness: There  is no abdominal tenderness. There is no rebound.  Genitourinary:    General: Normal vulva.     Exam position: Lithotomy position.     Pubic Area: No rash or pubic lice.      Labia:        Right: No rash or lesion.        Left: No rash or lesion.      Vagina: Normal. No vaginal discharge, erythema, bleeding or lesions.     Cervix: No cervical motion tenderness, discharge, friability, lesion or erythema.     Uterus: Normal.      Adnexa: Right adnexa normal and left adnexa normal.     Rectum: Normal.     Comments: pH = 4 Lymphadenopathy:     Head:     Right side of  head: No preauricular or posterior auricular adenopathy.     Left side of head: No preauricular or posterior auricular adenopathy.     Cervical: No cervical adenopathy.     Upper Body:     Right upper body: No supraclavicular, axillary or epitrochlear adenopathy.     Left upper body: No supraclavicular, axillary or epitrochlear adenopathy.     Lower Body: No right inguinal adenopathy. No left inguinal adenopathy.  Skin:    General: Skin is warm and dry.     Findings: No rash.  Neurological:     Mental Status: She is alert and oriented to person, place, and time.      Assessment and Plan:  Briana Jacobson is a 42 y.o. female presenting to the [facility] Department for an initial annual wellness/contraceptive visit  Contraception counseling: Reviewed options based on patient desire and reproductive life plan. Patient is interested in Female Sterilization. This was not provided to the patient today. Phone numbers provided for [facility] clinic and [facility].   Risks, benefits, and typical effectiveness rates were reviewed.  Questions were answered.  Written information was also given to the patient to review.    The patient will follow up in  1 years for surveillance.  The patient was told to call with any further questions, or with any concerns about this method of contraception.  Emphasized use of condoms 100% of the time for STI prevention.  Need for ECP was assessed. Not indicated- partner has vasectomy. LMP 04/12/22  1. Encounter for Nexplanon removal -nexplanon placed in 2016 Nexplanon Removal Patient identified, informed consent performed, consent signed.   Appropriate time out taken. Nexplanon site identified.  Area prepped in usual sterile fashon. 3 ml of 1% lidocaine with Epinephrine was used to anesthetize the area at the distal end of the implant and along implant site. A small stab incision was made right beside the implant on the distal portion.  The Nexplanon rod was  grasped using hemostats/manual and removed without difficulty.  There was minimal blood loss. There were no complications.  Steri-strips were applied over the small incision.  A pressure bandage was applied to reduce any bruising.  The patient tolerated the procedure well and was given post procedure instructions.    Nexplanon:   Counseled patient to take OTC analgesic starting as soon as lidocaine starts to wear off and take regularly for at least 48 hr to decrease discomfort.  Specifically to take with food or milk to decrease stomach upset and for IB 600 mg (3 tablets) every 6 hrs; IB 800 mg (4 tablets) every 8 hrs; or Aleve 2 tablets every 12 hrs.   2. Screening for  venereal disease  - HIV [place] [facility] - Syphilis Serology, [place] [facility] - Chlamydia/Gonorrhea [place] [facility] - WET PREP FOR [place], YEAST, CLUE  3. Well woman exam with routine gynecological exam -CBE normal, next due 04/30/23, pt has apt with PCP on 05/04/22, counseled to ask for mammogram referral -Pt wanted referral for therapy- citing multiple "stressors in life" denied depression or anxiety symptoms, smoking 1/2 ppd -counseled to quit smoking -encouraged exercise -has not had a pap test since 2016  - IGP, [facility] HPV  Return if symptoms worsen or fail to improve.  No future appointments.  Sharlet Salina, [place]

## 2022-04-29 NOTE — Progress Notes (Signed)
Pt is here for PE, Pap, STD screening and Nexplanon removal.  Pt is not interested in other forms of BC, at this time.  Windle Guard, RN

## 2022-05-04 LAB — IGP, APTIMA HPV
HPV Aptima: NEGATIVE
PAP Smear Comment: 0

## 2022-05-07 ENCOUNTER — Telehealth: Payer: Self-pay

## 2022-05-07 NOTE — Telephone Encounter (Signed)
Calling pt re positive chlamydia result from 04/29/22 vaginal specimen. Needs tx appt.  Phone call to pt at [phone]. Left voicemail that RN with [facility] is calling re TR. Please call Marcus Schwandt at [phone]. Tried twice.  Sent MyChart message also.

## 2022-05-10 NOTE — Telephone Encounter (Signed)
Received phone call from pt and pt confirmed password. Counseled pt re +CT result and need for tx.  Pt states NKA; just had birth control removed (Nexplanon) on 04/29/22 and has not had sex since then. Pt counseled to continue to abstain until after tx completed.  Tx appt scheduled for 05/11/22 (3 RN schedule).

## 2022-05-11 ENCOUNTER — Ambulatory Visit: Payer: Self-pay

## 2022-05-11 DIAGNOSIS — A749 Chlamydial infection, unspecified: Secondary | ICD-10-CM

## 2022-05-11 MED ORDER — DOXYCYCLINE HYCLATE 100 MG PO TABS: 100.0000 mg | ORAL_TABLET | Freq: Two times a day (BID) | ORAL | 0 refills | Status: AC

## 2022-05-11 NOTE — Progress Notes (Signed)
In nurse clinic for chlamydia treatment. NKA  Reports last sex 04/24/2022. Nexplanon removed 04/29/2022. LMP 05/04/2022 (normal).   Treated per [facility] Dr Vertell Novak with doxycycline 100 mg #14.   The patient was dispensed Doxycycline 100 mg #14 today. Instructions reviewed on how to take med. Doxycycline resource info sheet given.  I provided counseling today regarding the medication. We discussed the medication, the side effects and when to call clinic. Patient given the opportunity to ask questions. Questions answered.  Josie Saunders, RN

## 2022-05-20 ENCOUNTER — Ambulatory Visit: Payer: 59 | Admitting: Licensed Clinical Social Worker

## 2022-07-14 ENCOUNTER — Ambulatory Visit: Payer: 59 | Admitting: Licensed Clinical Social Worker
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
